# Patient Record
Sex: Female | Born: 1945 | Race: White | Hispanic: No | Smoking: Current every day smoker
Health system: Southern US, Community
[De-identification: ages and names within clinical notes are randomized; demographics above are authoritative.]

## PROBLEM LIST (undated history)

## (undated) DIAGNOSIS — K219 Gastro-esophageal reflux disease without esophagitis: Secondary | ICD-10-CM

## (undated) DIAGNOSIS — C801 Malignant (primary) neoplasm, unspecified: Secondary | ICD-10-CM

## (undated) DIAGNOSIS — E785 Hyperlipidemia, unspecified: Secondary | ICD-10-CM

## (undated) DIAGNOSIS — I2510 Atherosclerotic heart disease of native coronary artery without angina pectoris: Secondary | ICD-10-CM

## (undated) DIAGNOSIS — F419 Anxiety disorder, unspecified: Secondary | ICD-10-CM

## (undated) DIAGNOSIS — M1990 Unspecified osteoarthritis, unspecified site: Secondary | ICD-10-CM

## (undated) DIAGNOSIS — R609 Edema, unspecified: Secondary | ICD-10-CM

## (undated) DIAGNOSIS — E7800 Pure hypercholesterolemia, unspecified: Secondary | ICD-10-CM

## (undated) DIAGNOSIS — I219 Acute myocardial infarction, unspecified: Secondary | ICD-10-CM

## (undated) DIAGNOSIS — K589 Irritable bowel syndrome without diarrhea: Secondary | ICD-10-CM

## (undated) DIAGNOSIS — M2550 Pain in unspecified joint: Secondary | ICD-10-CM

## (undated) DIAGNOSIS — I509 Heart failure, unspecified: Secondary | ICD-10-CM

## (undated) DIAGNOSIS — I10 Essential (primary) hypertension: Secondary | ICD-10-CM

## (undated) DIAGNOSIS — M549 Dorsalgia, unspecified: Secondary | ICD-10-CM

## (undated) DIAGNOSIS — E039 Hypothyroidism, unspecified: Secondary | ICD-10-CM

## (undated) DIAGNOSIS — D649 Anemia, unspecified: Secondary | ICD-10-CM

## (undated) HISTORY — DX: Edema, unspecified: R60.9

## (undated) HISTORY — DX: Irritable bowel syndrome without diarrhea: K58.9

## (undated) HISTORY — PX: ABDOMINAL HYSTERECTOMY: SHX81

## (undated) HISTORY — DX: Heart failure, unspecified: I50.9

## (undated) HISTORY — DX: Dorsalgia, unspecified: M54.9

## (undated) HISTORY — DX: Anemia, unspecified: D64.9

## (undated) HISTORY — DX: Hyperlipidemia, unspecified: E78.5

## (undated) HISTORY — DX: Gastro-esophageal reflux disease without esophagitis: K21.9

## (undated) HISTORY — PX: MASTECTOMY: SHX3

## (undated) HISTORY — DX: Hypothyroidism, unspecified: E03.9

## (undated) HISTORY — PX: CORONARY ARTERY BYPASS GRAFT: SHX141

## (undated) HISTORY — DX: Pain in unspecified joint: M25.50

## (undated) HISTORY — DX: Anxiety disorder, unspecified: F41.9

## (undated) HISTORY — PX: DILATION AND CURETTAGE OF UTERUS: SHX78

## (undated) HISTORY — DX: Unspecified osteoarthritis, unspecified site: M19.90

## (undated) HISTORY — PX: CHOLECYSTECTOMY: SHX55

## (undated) HISTORY — PX: KIDNEY STONE SURGERY: SHX686

---

## 1999-03-21 ENCOUNTER — Encounter: Admission: RE | Admit: 1999-03-21 | Discharge: 1999-03-21 | Payer: Self-pay | Admitting: *Deleted

## 2000-06-04 ENCOUNTER — Encounter: Admission: RE | Admit: 2000-06-04 | Discharge: 2000-06-04 | Payer: Self-pay

## 2001-08-14 ENCOUNTER — Ambulatory Visit (HOSPITAL_BASED_OUTPATIENT_CLINIC_OR_DEPARTMENT_OTHER): Admission: RE | Admit: 2001-08-14 | Discharge: 2001-08-14 | Payer: Self-pay | Admitting: Plastic Surgery

## 2001-08-14 ENCOUNTER — Encounter (INDEPENDENT_AMBULATORY_CARE_PROVIDER_SITE_OTHER): Payer: Self-pay | Admitting: Specialist

## 2003-06-26 ENCOUNTER — Encounter: Admission: RE | Admit: 2003-06-26 | Discharge: 2003-06-26 | Payer: Self-pay | Admitting: Family Medicine

## 2004-01-01 ENCOUNTER — Ambulatory Visit (HOSPITAL_COMMUNITY): Admission: RE | Admit: 2004-01-01 | Discharge: 2004-01-01 | Payer: Self-pay | Admitting: Family Medicine

## 2004-01-04 ENCOUNTER — Ambulatory Visit (HOSPITAL_COMMUNITY): Admission: RE | Admit: 2004-01-04 | Discharge: 2004-01-04 | Payer: Self-pay | Admitting: Family Medicine

## 2004-01-08 ENCOUNTER — Ambulatory Visit (HOSPITAL_COMMUNITY): Admission: RE | Admit: 2004-01-08 | Discharge: 2004-01-08 | Payer: Self-pay | Admitting: Family Medicine

## 2004-04-18 ENCOUNTER — Ambulatory Visit (HOSPITAL_COMMUNITY): Admission: RE | Admit: 2004-04-18 | Discharge: 2004-04-18 | Payer: Self-pay | Admitting: Cardiology

## 2004-05-12 ENCOUNTER — Ambulatory Visit (HOSPITAL_COMMUNITY): Admission: RE | Admit: 2004-05-12 | Discharge: 2004-05-12 | Payer: Self-pay | Admitting: Family Medicine

## 2005-06-16 ENCOUNTER — Ambulatory Visit (HOSPITAL_COMMUNITY): Admission: RE | Admit: 2005-06-16 | Discharge: 2005-06-16 | Payer: Self-pay | Admitting: Family Medicine

## 2005-09-26 ENCOUNTER — Ambulatory Visit (HOSPITAL_COMMUNITY): Admission: RE | Admit: 2005-09-26 | Discharge: 2005-09-27 | Payer: Self-pay | Admitting: *Deleted

## 2005-11-19 ENCOUNTER — Emergency Department (HOSPITAL_COMMUNITY): Admission: EM | Admit: 2005-11-19 | Discharge: 2005-11-19 | Payer: Self-pay | Admitting: Emergency Medicine

## 2007-06-03 ENCOUNTER — Encounter (INDEPENDENT_AMBULATORY_CARE_PROVIDER_SITE_OTHER): Payer: Self-pay | Admitting: Family Medicine

## 2007-06-03 ENCOUNTER — Ambulatory Visit (HOSPITAL_COMMUNITY): Admission: RE | Admit: 2007-06-03 | Discharge: 2007-06-03 | Payer: Self-pay | Admitting: Family Medicine

## 2007-06-03 ENCOUNTER — Ambulatory Visit: Payer: Self-pay | Admitting: Vascular Surgery

## 2007-08-20 ENCOUNTER — Inpatient Hospital Stay (HOSPITAL_COMMUNITY): Admission: RE | Admit: 2007-08-20 | Discharge: 2007-08-26 | Payer: Self-pay | Admitting: Orthopedic Surgery

## 2007-10-04 ENCOUNTER — Encounter: Admission: RE | Admit: 2007-10-04 | Discharge: 2007-10-04 | Payer: Self-pay | Admitting: Orthopedic Surgery

## 2007-11-22 ENCOUNTER — Encounter: Admission: RE | Admit: 2007-11-22 | Discharge: 2007-11-22 | Payer: Self-pay | Admitting: Orthopedic Surgery

## 2007-11-29 ENCOUNTER — Encounter: Admission: RE | Admit: 2007-11-29 | Discharge: 2008-02-05 | Payer: Self-pay | Admitting: Orthopedic Surgery

## 2008-01-30 ENCOUNTER — Encounter: Admission: RE | Admit: 2008-01-30 | Discharge: 2008-01-30 | Payer: Self-pay | Admitting: Family Medicine

## 2008-02-05 ENCOUNTER — Encounter: Admission: RE | Admit: 2008-02-05 | Discharge: 2008-02-05 | Payer: Self-pay | Admitting: Family Medicine

## 2008-04-06 ENCOUNTER — Inpatient Hospital Stay (HOSPITAL_COMMUNITY): Admission: RE | Admit: 2008-04-06 | Discharge: 2008-04-14 | Payer: Self-pay | Admitting: Orthopedic Surgery

## 2008-08-19 ENCOUNTER — Inpatient Hospital Stay (HOSPITAL_COMMUNITY): Admission: EM | Admit: 2008-08-19 | Discharge: 2008-08-28 | Payer: Self-pay | Admitting: Emergency Medicine

## 2008-09-13 ENCOUNTER — Inpatient Hospital Stay (HOSPITAL_COMMUNITY): Admission: EM | Admit: 2008-09-13 | Discharge: 2008-09-18 | Payer: Self-pay | Admitting: Emergency Medicine

## 2008-10-02 ENCOUNTER — Encounter: Admission: RE | Admit: 2008-10-02 | Discharge: 2008-10-02 | Payer: Self-pay | Admitting: Orthopedic Surgery

## 2008-10-16 ENCOUNTER — Encounter: Admission: RE | Admit: 2008-10-16 | Discharge: 2008-10-16 | Payer: Self-pay | Admitting: Orthopedic Surgery

## 2008-11-19 ENCOUNTER — Observation Stay (HOSPITAL_COMMUNITY): Admission: EM | Admit: 2008-11-19 | Discharge: 2008-11-20 | Payer: Self-pay | Admitting: Cardiology

## 2009-05-03 ENCOUNTER — Inpatient Hospital Stay (HOSPITAL_COMMUNITY): Admission: EM | Admit: 2009-05-03 | Discharge: 2009-05-07 | Payer: Self-pay | Admitting: Emergency Medicine

## 2009-05-03 ENCOUNTER — Ambulatory Visit: Payer: Self-pay | Admitting: Internal Medicine

## 2009-05-04 ENCOUNTER — Encounter (INDEPENDENT_AMBULATORY_CARE_PROVIDER_SITE_OTHER): Payer: Self-pay | Admitting: Internal Medicine

## 2009-05-04 ENCOUNTER — Ambulatory Visit: Payer: Self-pay | Admitting: Vascular Surgery

## 2009-05-05 ENCOUNTER — Encounter: Payer: Self-pay | Admitting: Internal Medicine

## 2009-06-08 ENCOUNTER — Ambulatory Visit: Payer: Self-pay | Admitting: Vascular Surgery

## 2010-05-28 ENCOUNTER — Encounter: Payer: Self-pay | Admitting: Cardiology

## 2010-05-30 ENCOUNTER — Encounter: Payer: Self-pay | Admitting: Orthopedic Surgery

## 2010-05-30 ENCOUNTER — Encounter: Payer: Self-pay | Admitting: Family Medicine

## 2010-06-17 ENCOUNTER — Other Ambulatory Visit: Payer: Self-pay | Admitting: Family Medicine

## 2010-06-17 DIAGNOSIS — N289 Disorder of kidney and ureter, unspecified: Secondary | ICD-10-CM

## 2010-06-24 ENCOUNTER — Ambulatory Visit
Admission: RE | Admit: 2010-06-24 | Discharge: 2010-06-24 | Disposition: A | Discharge status: Home / Self Care | Payer: Medicare Other | Source: Ambulatory Visit | Attending: Family Medicine | Admitting: Family Medicine

## 2010-06-24 ENCOUNTER — Other Ambulatory Visit: Payer: Self-pay

## 2010-06-24 DIAGNOSIS — N289 Disorder of kidney and ureter, unspecified: Secondary | ICD-10-CM

## 2010-06-24 MED ORDER — GADOBENATE DIMEGLUMINE 529 MG/ML IV SOLN
14.0000 mL | Freq: Once | INTRAVENOUS | Status: AC | PRN
  Administered 2010-06-24: 14 mL via INTRAVENOUS

## 2010-08-08 LAB — CARDIAC PANEL(CRET KIN+CKTOT+MB+TROPI)
CK, MB: 3.5 ng/mL (ref 0.3–4.0)
CK, MB: 4.4 ng/mL — ABNORMAL HIGH (ref 0.3–4.0)
CK, MB: 7 ng/mL — ABNORMAL HIGH (ref 0.3–4.0)
Relative Index: 0.4 (ref 0.0–2.5)

## 2010-08-08 LAB — DIFFERENTIAL
Basophils Relative: 0 % (ref 0–1)
Basophils Relative: 0 % (ref 0–1)
Basophils Relative: 0 % (ref 0–1)
Eosinophils Absolute: 0 10*3/uL (ref 0.0–0.7)
Eosinophils Absolute: 0 10*3/uL (ref 0.0–0.7)
Lymphocytes Relative: 17 % (ref 12–46)
Lymphs Abs: 0.5 10*3/uL — ABNORMAL LOW (ref 0.7–4.0)
Lymphs Abs: 1.2 10*3/uL (ref 0.7–4.0)
Monocytes Absolute: 0.4 10*3/uL (ref 0.1–1.0)
Monocytes Relative: 2 % — ABNORMAL LOW (ref 3–12)
Monocytes Relative: 4 % (ref 3–12)
Monocytes Relative: 6 % (ref 3–12)
Neutro Abs: 5.3 10*3/uL (ref 1.7–7.7)
Neutro Abs: 7.3 10*3/uL (ref 1.7–7.7)
Neutrophils Relative %: 77 % (ref 43–77)
Neutrophils Relative %: 86 % — ABNORMAL HIGH (ref 43–77)
Neutrophils Relative %: 91 % — ABNORMAL HIGH (ref 43–77)

## 2010-08-08 LAB — CBC
HCT: 24.4 % — ABNORMAL LOW (ref 36.0–46.0)
HCT: 26.9 % — ABNORMAL LOW (ref 36.0–46.0)
HCT: 38.6 % (ref 36.0–46.0)
Hemoglobin: 12.9 g/dL (ref 12.0–15.0)
Hemoglobin: 8.1 g/dL — ABNORMAL LOW (ref 12.0–15.0)
Hemoglobin: 9 g/dL — ABNORMAL LOW (ref 12.0–15.0)
Hemoglobin: 9.1 g/dL — ABNORMAL LOW (ref 12.0–15.0)
MCHC: 33.4 g/dL (ref 30.0–36.0)
MCHC: 33.4 g/dL (ref 30.0–36.0)
MCHC: 33.8 g/dL (ref 30.0–36.0)
MCHC: 33.9 g/dL (ref 30.0–36.0)
MCV: 94.3 fL (ref 78.0–100.0)
MCV: 94.3 fL (ref 78.0–100.0)
MCV: 94.9 fL (ref 78.0–100.0)
Platelets: 146 10*3/uL — ABNORMAL LOW (ref 150–400)
RBC: 2.58 MIL/uL — ABNORMAL LOW (ref 3.87–5.11)
RBC: 2.7 MIL/uL — ABNORMAL LOW (ref 3.87–5.11)
RBC: 2.84 MIL/uL — ABNORMAL LOW (ref 3.87–5.11)
RBC: 2.88 MIL/uL — ABNORMAL LOW (ref 3.87–5.11)
RBC: 4.09 MIL/uL (ref 3.87–5.11)
RDW: 14 % (ref 11.5–15.5)
RDW: 14.8 % (ref 11.5–15.5)
RDW: 15.1 % (ref 11.5–15.5)
WBC: 6.9 10*3/uL (ref 4.0–10.5)
WBC: 7.8 10*3/uL (ref 4.0–10.5)
WBC: 8.7 10*3/uL (ref 4.0–10.5)

## 2010-08-08 LAB — COMPREHENSIVE METABOLIC PANEL
ALT: 19 U/L (ref 0–35)
ALT: 21 U/L (ref 0–35)
Albumin: 2.7 g/dL — ABNORMAL LOW (ref 3.5–5.2)
Alkaline Phosphatase: 80 U/L (ref 39–117)
BUN: 23 mg/dL (ref 6–23)
BUN: 26 mg/dL — ABNORMAL HIGH (ref 6–23)
CO2: 26 mEq/L (ref 19–32)
CO2: 30 mEq/L (ref 19–32)
Calcium: 7.8 mg/dL — ABNORMAL LOW (ref 8.4–10.5)
Calcium: 9.4 mg/dL (ref 8.4–10.5)
Chloride: 103 mEq/L (ref 96–112)
Creatinine, Ser: 0.99 mg/dL (ref 0.4–1.2)
GFR calc non Af Amer: 39 mL/min — ABNORMAL LOW (ref >60)
GFR calc non Af Amer: 47 mL/min — ABNORMAL LOW (ref >60)
Glucose, Bld: 113 mg/dL — ABNORMAL HIGH (ref 70–99)
Glucose, Bld: 119 mg/dL — ABNORMAL HIGH (ref 70–99)
Glucose, Bld: 123 mg/dL — ABNORMAL HIGH (ref 70–99)
Potassium: 3.1 mEq/L — ABNORMAL LOW (ref 3.5–5.1)
Sodium: 136 mEq/L (ref 135–145)
Sodium: 136 mEq/L (ref 135–145)
Total Bilirubin: 0.5 mg/dL (ref 0.3–1.2)
Total Protein: 5.8 g/dL — ABNORMAL LOW (ref 6.0–8.3)
Total Protein: 5.8 g/dL — ABNORMAL LOW (ref 6.0–8.3)
Total Protein: 8.3 g/dL (ref 6.0–8.3)

## 2010-08-08 LAB — BLOOD GAS, ARTERIAL
Acid-Base Excess: 2.8 mmol/L — ABNORMAL HIGH (ref 0.0–2.0)
O2 Content: 3 L/min
O2 Saturation: 94.7 %
pCO2 arterial: 38.9 mmHg (ref 35.0–45.0)

## 2010-08-08 LAB — CULTURE, BLOOD (ROUTINE X 2)

## 2010-08-08 LAB — LIPID PANEL
Cholesterol: 97 mg/dL (ref 0–200)
LDL Cholesterol: 48 mg/dL (ref 0–99)
Total CHOL/HDL Ratio: 3.9 RATIO
Triglycerides: 118 mg/dL (ref <150)

## 2010-08-08 LAB — BASIC METABOLIC PANEL
BUN: 9 mg/dL (ref 6–23)
CO2: 24 mEq/L (ref 19–32)
CO2: 26 mEq/L (ref 19–32)
Calcium: 7.9 mg/dL — ABNORMAL LOW (ref 8.4–10.5)
GFR calc Af Amer: >60 mL/min (ref >60)
GFR calc non Af Amer: >60 mL/min (ref >60)
Glucose, Bld: 134 mg/dL — ABNORMAL HIGH (ref 70–99)
Potassium: 3.1 mEq/L — ABNORMAL LOW (ref 3.5–5.1)
Potassium: 3.9 mEq/L (ref 3.5–5.1)
Sodium: 137 mEq/L (ref 135–145)
Sodium: 138 mEq/L (ref 135–145)

## 2010-08-08 LAB — URINALYSIS, ROUTINE W REFLEX MICROSCOPIC
Glucose, UA: NEGATIVE mg/dL
Hgb urine dipstick: NEGATIVE
Protein, ur: NEGATIVE mg/dL
Specific Gravity, Urine: 1.013 (ref 1.005–1.030)
pH: 5.5 (ref 5.0–8.0)

## 2010-08-08 LAB — URINE CULTURE
Colony Count: NO GROWTH
Culture: NO GROWTH

## 2010-08-08 LAB — SALICYLATE LEVEL: Salicylate Lvl: <4 mg/dL (ref 2.8–20.0)

## 2010-08-08 LAB — TSH: TSH: 0.226 u[IU]/mL — ABNORMAL LOW (ref 0.350–4.500)

## 2010-08-08 LAB — LACTIC ACID, PLASMA: Lactic Acid, Venous: 0.8 mmol/L (ref 0.5–2.2)

## 2010-08-08 LAB — APTT: aPTT: 34 seconds (ref 24–37)

## 2010-08-08 LAB — PROTIME-INR: Prothrombin Time: 15.5 seconds — ABNORMAL HIGH (ref 11.6–15.2)

## 2010-08-14 LAB — CBC
HCT: 33.8 % — ABNORMAL LOW (ref 36.0–46.0)
Hemoglobin: 11.5 g/dL — ABNORMAL LOW (ref 12.0–15.0)
MCHC: 34.1 g/dL (ref 30.0–36.0)
MCV: 91.8 fL (ref 78.0–100.0)
RDW: 15 % (ref 11.5–15.5)

## 2010-08-14 LAB — BASIC METABOLIC PANEL
BUN: 9 mg/dL (ref 6–23)
Calcium: 9 mg/dL (ref 8.4–10.5)
Creatinine, Ser: 0.74 mg/dL (ref 0.4–1.2)
GFR calc non Af Amer: >60 mL/min (ref >60)
Glucose, Bld: 97 mg/dL (ref 70–99)

## 2010-08-14 LAB — LIPID PANEL
LDL Cholesterol: 69 mg/dL (ref 0–99)
Total CHOL/HDL Ratio: 4.5 RATIO
Triglycerides: 179 mg/dL — ABNORMAL HIGH (ref <150)
VLDL: 36 mg/dL (ref 0–40)

## 2010-08-14 LAB — CARDIAC PANEL(CRET KIN+CKTOT+MB+TROPI): CK, MB: 0.5 ng/mL (ref 0.3–4.0)

## 2010-08-14 LAB — MAGNESIUM: Magnesium: 1.9 mg/dL (ref 1.5–2.5)

## 2010-08-16 LAB — CROSSMATCH
ABO/RH(D): B POS
Antibody Screen: POSITIVE
Donor AG Type: NEGATIVE

## 2010-08-16 LAB — BASIC METABOLIC PANEL
BUN: 11 mg/dL (ref 6–23)
CO2: 26 mEq/L (ref 19–32)
CO2: 28 mEq/L (ref 19–32)
Calcium: 8.8 mg/dL (ref 8.4–10.5)
Chloride: 102 mEq/L (ref 96–112)
Chloride: 94 mEq/L — ABNORMAL LOW (ref 96–112)
Chloride: 96 mEq/L (ref 96–112)
Creatinine, Ser: 0.96 mg/dL (ref 0.4–1.2)
GFR calc Af Amer: >60 mL/min (ref >60)
Glucose, Bld: 109 mg/dL — ABNORMAL HIGH (ref 70–99)
Potassium: 3.3 mEq/L — ABNORMAL LOW (ref 3.5–5.1)
Potassium: 4.3 mEq/L (ref 3.5–5.1)
Sodium: 130 mEq/L — ABNORMAL LOW (ref 135–145)

## 2010-08-16 LAB — CBC
HCT: 31.3 % — ABNORMAL LOW (ref 36.0–46.0)
Hemoglobin: 10 g/dL — ABNORMAL LOW (ref 12.0–15.0)
Hemoglobin: 8.6 g/dL — ABNORMAL LOW (ref 12.0–15.0)
MCHC: 34.3 g/dL (ref 30.0–36.0)
MCHC: 34.8 g/dL (ref 30.0–36.0)
MCV: 92.7 fL (ref 78.0–100.0)
MCV: 93.4 fL (ref 78.0–100.0)
MCV: 93.4 fL (ref 78.0–100.0)
Platelets: 325 10*3/uL (ref 150–400)
RBC: 3.08 MIL/uL — ABNORMAL LOW (ref 3.87–5.11)
RBC: 3.35 MIL/uL — ABNORMAL LOW (ref 3.87–5.11)
RDW: 15.9 % — ABNORMAL HIGH (ref 11.5–15.5)
WBC: 5.4 10*3/uL (ref 4.0–10.5)

## 2010-08-16 LAB — ANAEROBIC CULTURE

## 2010-08-16 LAB — HEMOGLOBIN AND HEMATOCRIT, BLOOD
HCT: 34.6 % — ABNORMAL LOW (ref 36.0–46.0)
Hemoglobin: 11.9 g/dL — ABNORMAL LOW (ref 12.0–15.0)

## 2010-08-16 LAB — GRAM STAIN: Gram Stain: NONE SEEN

## 2010-08-17 LAB — BASIC METABOLIC PANEL
BUN: 13 mg/dL (ref 6–23)
BUN: 14 mg/dL (ref 6–23)
BUN: 18 mg/dL (ref 6–23)
BUN: 21 mg/dL (ref 6–23)
BUN: 28 mg/dL — ABNORMAL HIGH (ref 6–23)
CO2: 25 mEq/L (ref 19–32)
CO2: 26 mEq/L (ref 19–32)
CO2: 27 mEq/L (ref 19–32)
CO2: 29 mEq/L (ref 19–32)
CO2: 30 mEq/L (ref 19–32)
Calcium: 8.3 mg/dL — ABNORMAL LOW (ref 8.4–10.5)
Calcium: 8.5 mg/dL (ref 8.4–10.5)
Calcium: 8.5 mg/dL (ref 8.4–10.5)
Calcium: 9.3 mg/dL (ref 8.4–10.5)
Calcium: 9.4 mg/dL (ref 8.4–10.5)
Chloride: 101 mEq/L (ref 96–112)
Chloride: 102 mEq/L (ref 96–112)
Chloride: 102 mEq/L (ref 96–112)
Chloride: 104 mEq/L (ref 96–112)
Chloride: 95 mEq/L — ABNORMAL LOW (ref 96–112)
Chloride: 99 mEq/L (ref 96–112)
Creatinine, Ser: 0.71 mg/dL (ref 0.4–1.2)
Creatinine, Ser: 0.91 mg/dL (ref 0.4–1.2)
Creatinine, Ser: 0.91 mg/dL (ref 0.4–1.2)
Creatinine, Ser: 1.13 mg/dL (ref 0.4–1.2)
Creatinine, Ser: 1.12 mg/dL (ref 0.4–1.2)
GFR calc Af Amer: 59 mL/min — ABNORMAL LOW (ref >60)
GFR calc Af Amer: >60 mL/min (ref >60)
GFR calc Af Amer: >60 mL/min (ref >60)
GFR calc Af Amer: 59 mL/min — ABNORMAL LOW (ref >60)
GFR calc non Af Amer: 49 mL/min — ABNORMAL LOW (ref >60)
GFR calc non Af Amer: 57 mL/min — ABNORMAL LOW (ref >60)
GFR calc non Af Amer: >60 mL/min (ref >60)
GFR calc non Af Amer: >60 mL/min (ref >60)
GFR calc non Af Amer: >60 mL/min (ref >60)
Glucose, Bld: 118 mg/dL — ABNORMAL HIGH (ref 70–99)
Glucose, Bld: 120 mg/dL — ABNORMAL HIGH (ref 70–99)
Glucose, Bld: 135 mg/dL — ABNORMAL HIGH (ref 70–99)
Potassium: 3.6 mEq/L (ref 3.5–5.1)
Potassium: 3.7 mEq/L (ref 3.5–5.1)
Potassium: 3.9 mEq/L (ref 3.5–5.1)
Potassium: 4.4 mEq/L (ref 3.5–5.1)
Potassium: 4.4 mEq/L (ref 3.5–5.1)
Sodium: 134 mEq/L — ABNORMAL LOW (ref 135–145)
Sodium: 135 mEq/L (ref 135–145)
Sodium: 140 mEq/L (ref 135–145)
Sodium: 137 mEq/L (ref 135–145)

## 2010-08-17 LAB — CBC
HCT: 24.3 % — ABNORMAL LOW (ref 36.0–46.0)
HCT: 30 % — ABNORMAL LOW (ref 36.0–46.0)
HCT: 31.7 % — ABNORMAL LOW (ref 36.0–46.0)
HCT: 32.8 % — ABNORMAL LOW (ref 36.0–46.0)
HCT: 34.3 % — ABNORMAL LOW (ref 36.0–46.0)
Hemoglobin: 10.3 g/dL — ABNORMAL LOW (ref 12.0–15.0)
Hemoglobin: 10.6 g/dL — ABNORMAL LOW (ref 12.0–15.0)
Hemoglobin: 11 g/dL — ABNORMAL LOW (ref 12.0–15.0)
Hemoglobin: 11.2 g/dL — ABNORMAL LOW (ref 12.0–15.0)
MCHC: 34.2 g/dL (ref 30.0–36.0)
MCHC: 34.4 g/dL (ref 30.0–36.0)
MCHC: 34.6 g/dL (ref 30.0–36.0)
MCHC: 34.8 g/dL (ref 30.0–36.0)
MCV: 93.7 fL (ref 78.0–100.0)
MCV: 98.6 fL (ref 78.0–100.0)
MCV: 98.7 fL (ref 78.0–100.0)
MCV: 99 fL (ref 78.0–100.0)
MCV: 98.4 fL (ref 78.0–100.0)
Platelets: 184 10*3/uL (ref 150–400)
Platelets: 187 10*3/uL (ref 150–400)
Platelets: 204 10*3/uL (ref 150–400)
Platelets: 207 10*3/uL (ref 150–400)
Platelets: 237 10*3/uL (ref 150–400)
Platelets: 302 10*3/uL (ref 150–400)
RBC: 3.04 MIL/uL — ABNORMAL LOW (ref 3.87–5.11)
RBC: 3.15 MIL/uL — ABNORMAL LOW (ref 3.87–5.11)
RBC: 3.21 MIL/uL — ABNORMAL LOW (ref 3.87–5.11)
RBC: 3.52 MIL/uL — ABNORMAL LOW (ref 3.87–5.11)
RDW: 14.1 % (ref 11.5–15.5)
RDW: 14.2 % (ref 11.5–15.5)
RDW: 14.4 % (ref 11.5–15.5)
WBC: 10.7 10*3/uL — ABNORMAL HIGH (ref 4.0–10.5)
WBC: 5.7 10*3/uL (ref 4.0–10.5)
WBC: 6.7 10*3/uL (ref 4.0–10.5)
WBC: 6.9 10*3/uL (ref 4.0–10.5)
WBC: 7.8 10*3/uL (ref 4.0–10.5)
WBC: 9.6 10*3/uL (ref 4.0–10.5)
WBC: 9.4 10*3/uL (ref 4.0–10.5)

## 2010-08-17 LAB — CROSSMATCH
ABO/RH(D): B POS
Antibody Screen: POSITIVE
Donor AG Type: NEGATIVE
Donor AG Type: NEGATIVE

## 2010-08-17 LAB — DIFFERENTIAL
Eosinophils Absolute: 0 10*3/uL (ref 0.0–0.7)
Lymphs Abs: 2.1 10*3/uL (ref 0.7–4.0)
Monocytes Relative: 4 % (ref 3–12)
Neutro Abs: 6.8 10*3/uL (ref 1.7–7.7)
Neutrophils Relative %: 72 % (ref 43–77)

## 2010-08-17 LAB — URINALYSIS, ROUTINE W REFLEX MICROSCOPIC
Bilirubin Urine: NEGATIVE
Glucose, UA: NEGATIVE mg/dL
Ketones, ur: NEGATIVE mg/dL
Nitrite: NEGATIVE
Specific Gravity, Urine: 1.005 (ref 1.005–1.030)
pH: 6.5 (ref 5.0–8.0)

## 2010-08-17 LAB — CK TOTAL AND CKMB (NOT AT ARMC)
CK, MB: 1 ng/mL (ref 0.3–4.0)
Relative Index: 0.8 (ref 0.0–2.5)
Total CK: 120 U/L (ref 7–177)

## 2010-08-17 LAB — APTT: aPTT: 26 seconds (ref 24–37)

## 2010-08-17 LAB — BRAIN NATRIURETIC PEPTIDE: Pro B Natriuretic peptide (BNP): 188 pg/mL — ABNORMAL HIGH (ref 0.0–100.0)

## 2010-08-17 LAB — TYPE AND SCREEN
Antibody Screen: POSITIVE
Donor AG Type: NEGATIVE

## 2010-08-17 LAB — LIPID PANEL
Cholesterol: 125 mg/dL (ref 0–200)
HDL: 33 mg/dL — ABNORMAL LOW (ref >39)
LDL Cholesterol: 69 mg/dL (ref 0–99)
Total CHOL/HDL Ratio: 3.8 RATIO

## 2010-09-20 NOTE — Op Note (Signed)
NAMERENIA, [name]               ACCOUNT NO.:  [id number]   MEDICAL RECORD NO.:  [id number]          PATIENT TYPE:  INP   LOCATION:  0004                         FACILITY:  [facility]   PHYSICIAN:  Madlyn Frankel. Charlann Boxer, M.D.  DATE OF BIRTH:  1946/02/23   DATE OF PROCEDURE:  04/06/2008  DATE OF DISCHARGE:                               OPERATIVE REPORT   PREOPERATIVE DIAGNOSIS:  Right hip osteoarthritis.   POSTOPERATIVE DIAGNOSIS:  Right hip osteoarthritis.   PROCEDURE:  Right total hip replacement.   COMPONENTS USED:  DePuy hip system, 54 pinnacle cup, 36 metal liner, 75  trial lock stem with 36 1.5 ball.   SURGEON:  Madlyn Frankel. Charlann Boxer, M.D.   ASSISTANT:  Dwyane Luo.   ANESTHESIA:  General.   SPECIMEN:  None.   DRAINS:  None.   COMPLICATIONS:  None.   BLOOD LOSS:  150 mL.   INDICATIONS FOR PROCEDURE:  Ms. Deviney is a 65 year old female with  history of left total hip replacement and has done well.  She had  progressive discomfort in the right hip and at this point wished to  proceed with a right total hip replacement.  We reviewed the risks and  benefits of the hospital course, postoperative course and expectations.  She would require a skilled nursing facility as she had before.  Consent  obtained for benefit of pain relief.   PROCEDURE IN DETAIL:  The patient was brought to the operative theater.  Once adequate anesthesia, preoperative antibiotics, Ancef administered,  the patient was positioned in the left lateral decubitus position with  right-side up.  Preoperatively, I measured her leg lengths in the supine  position and evaluated the position of her right lower extremity  compared to left in its lateral position.  A lateral based incision was  made for a posterior approach to the hip.  The iliotibial band and  gluteal fascia were then incised posteriorly.  The short external  rotators were taken down except for the posterior capsule was taken down  in an L capsule form.   The posterior leaflet was saved for later  anatomic repair as well as protection of the sciatic nerve from  retractors.  The hip was dislocated and a neck osteotomy was made to the  troch. fossa.  Attention was first directed to the acetabulum.  I  removed the labrum and debrided the pulvinar tissue and reamed down with  a 43 reamer and then reamed up to 53 reamer with good bony bed  preparation.  I impacted a 54 pinnacle cup.  Then with the pelvis at a  level position, the cup positioner indicated a position of about 35-40  degrees of abduction and 20 degrees of forward flexion.  I placed a  single cancellous screw to help the initial scratch fit fixation and  placed a 36 neutral metal liner.  This was impacted without difficulty  with good benefit.   Attention was now directed to the femoral canal preparation using the  starting drill, the hand reamer once, and then began broaching with a  size 1 broach and went  up to a size 7 broach.  This broach sat down  below my neck cut, so I used the calcar planer.  On the contralateral  hip, I used the 5 standard neck cut and it appeared to be a little bit  lower.  I took this in mind in my leg length evaluation.   The 7 broach was placed high offset neck,  I used this on the other side  and a 36.15 ball.   A trial reduction indicated the hip was very stable, tolerated hip  flexion and internal rotation to about 7 degrees. No evidence of  impingement or subluxation with abduction, internal rotation at 40-50  degrees, there was about a millimeter of shuck. No evidence of  impingement with external rotation extension.   The leg lengths appeared to be exactly as they were in the preoperative  position with relation to the knee and the femur.  Given this, I went  ahead and removed the trial components.  The final 7 high stem was  opened and impacted to the level where the broach had sat.  Based on  this, I re-trialed just to make certain I was  happy with the ball size  and chose a 36.15 ball and the final ball was impacted onto a clean and  dry trunnion and the hip reduced.  We irrigated the hip throughout the  case.  Again, at this point, there was no significant hemostasis that  was required, no bleeding throughout the case.  No drain was chosen.  The posterior capsule was reapproximated to the superior leaflet using a  #1 Vicryl.  The iliotibial band and gluteal fascia were reapproximated  using #1 Vicryl.  I used 2-0 Vicryl in the subcu layer and 4-0 running  Monocryl in the skin.  The hip was clean, dry and dressed sterilely with  Steri-Strips and a Mepilex dressing.  She was brought to the recovery  room in stable condition and tolerated the procedure well.      Madlyn Frankel Charlann Boxer, M.D.  Electronically Signed     MDO/MEDQ  D:  04/06/2008  T:  04/06/2008  Job:  [id number]

## 2010-09-20 NOTE — Consult Note (Signed)
NEW PATIENT CONSULTATION   [name], [name]  DOB:  08-25-1945                                       06/08/2009  OZHYQ#:[id number]   The patient is a 65 year old female referred for right upper extremity  edema with recent history of cellulitis.  She was accompanied by records  provided by Dr. Azucena Cecil for a recent hospitalization for cellulitis in  the right upper extremity with possible abscess.  She was treated with  Avelox for 2 weeks with resolution of the cellulitis.  She states that  her arm continues to be slightly more swollen than it has been over the  years.  She has had swelling for 30 years ever since she underwent a  right mastectomy for cancer in 1980.  She has never had effective  resolution of the lymphedema in her right upper extremity.  She has no  history of deep venous thrombosis or thrombophlebitis.  She does have a  sleeve which she has worn intermittently at times over the years but it  does not fit at this time.  She has not been seen by a lymphedema  specialist.   CHRONIC MEDICAL PROBLEMS:  1. Hypertension.  2. Coronary artery disease status post coronary bypass grafting.  3. Breast cancer and mastectomy 1984.  4. Hyperlipidemia.  5. Negative for diabetes or stroke or COPD.   PAST SURGICAL HISTORY:  Includes coronary artery bypass grafting,  hysterectomy, five operations for breast cancer and removal of several  kidney stones right side.   FAMILY HISTORY:  Positive for coronary artery disease with her father  and brother dying at young age of myocardial infarction.  Brother had  strokes.  Diabetes in a grandmother.   SOCIAL HISTORY:  She is single, has two children and is disabled and  retired.  She has smoked a pack to three packs of cigarettes a day for  40+ years.  Does not use alcohol.   REVIEW OF SYSTEMS:  Positive for reflux, dysphagia, constipation,  discomfort in the legs with walking, recent trauma to right fifth toe,  problems following right hip surgery, dizziness, blackouts, headaches,  muscle pain, anemia.  All other systems are negative.   PHYSICAL EXAMINATION:  Vital signs:  Blood pressure 128/77, heart rate  70, respirations 14.  General:  She is a female patient who is alert and  oriented.  She is well-developed and well-nourished.  Neck:  Supple.  3+  carotid pulses.  No bruits are audible.  HEENT:  Exam is unremarkable.  EOMs intact.  Chest:  Clear to auscultation.  Cardiovascular:  Regular  rhythm.  No murmurs.  There is an intact median sternotomy wound which  is well-healed.  Right mastectomy has been performed.  Abdomen:  Soft,  nontender with no masses.  Musculoskeletal:  No major deformities.  Neurological:  Unremarkable.  No weakness.  Skin:  Free of rashes.  Extremity:  Upper extremity reveals the right forearm to be 8 cm larger  in circumference compared to the left.  She has diffuse edema from the  right axilla to the fingers.  There is no evidence of any infection at  the present time with no cellulitis or fluctuance.  Left upper extremity  has no edema.  She has intact brachial and radial pulses at 3+  bilaterally.   I ordered a venous duplex exam  of the right upper extremity today which  I reviewed and interpreted.  There is no evidence of deep venous  obstruction in the right upper extremity.  I also reviewed her records  as noted provided by Dr. Azucena Cecil.   IMPRESSION:  Chronic lymphedema right upper extremity following  mastectomy with recent exacerbation - cellulitis now resolving following  2 week course of antibiotic (Avelox).   RECOMMENDATIONS:  I have referred her to a lymphedema therapist Alvira Monday) located on [address], phone [phone] to treat this chronic  problem of lymphedema.  She will always be at risk of developing  cellulitis in the right upper extremity because of the chronic edema.  No other recommendations.     Quita Skye Hart [place], M.D.   Electronically Signed   JDL/MEDQ  D:  06/08/2009  T:  06/09/2009  Job:  [id number]   cc:   Tally Joe, M.D.

## 2010-09-20 NOTE — Op Note (Signed)
NAMEGERLENE, GLASSBURN               ACCOUNT NO.:  [id number]   MEDICAL RECORD NO.:  [id number]          PATIENT TYPE:  INP   LOCATION:  1606                         FACILITY:  [facility]   PHYSICIAN:  Madlyn Frankel. Charlann Boxer, M.D.  DATE OF BIRTH:  02-Sep-1945   DATE OF PROCEDURE:  08/24/2008  DATE OF DISCHARGE:                               OPERATIVE REPORT   PREOPERATIVE DIAGNOSIS:  Closed right periprosthetic femur fracture,  type C, distal to a well-fixed proximal right hip prosthesis that had  been previously fixed with an open reduction and internal fixation.   POSTOPERATIVE DIAGNOSIS:  Closed right periprosthetic femur fracture,  type C, distal to a well-fixed proximal right hip prosthesis that had  been previously fixed with an open reduction and internal fixation.   PROCEDURE:  Open reduction and internal fixation of right periprosthetic  femur fracture utilizing the Zimmer cable plate, 8-hole plate, and  anterior cortical strut graft with 6 cables around the plate and 4  bicortical screws into the distal portion of the femur.   SURGEON:  Madlyn Frankel. Charlann Boxer, MD.   ASSISTANT:  Surgical tech   ANESTHESIA:  General.   BLOOD LOSS:  500 mL.   DRAINS:  None.   COMPLICATIONS:  None.   SPECIMENS:  None.   INDICATIONS FOR PROCEDURE:  Ms. Cazarez is 65 year old patient of mine  with a history of index right total hip replacement in November of 2009.  During her hospitalization at that time, she had fallen several times  and sustained a periprosthetic femur fracture at that time.  At that  point, it was a type B fracture pattern that was within the body of the  prosthesis and I chose to fix it with an open reduction and internal  fixation with cable fixation.  In the postoperative period, she had  initially gone to a nursing facility and transferred home.  She had been  questionably compliant on her activity level and medication use.  She  had had some complaints of anterior thigh pain, and  we even prescribed a  bone stimulator in an effort to try to get some bone healing.   Unfortunately on the 14th of this month, she was in her house and trying  to get to the wheelchair when her leg gave way.  It is uncertain whether  or not the fracture happened prior to fall or a fall.  Nonetheless, she  was brought to the emergency room where radiographs revealed a  periprosthetic femur fracture and she was admitted.  Initially, we were  going to try to operate on her on the 15th; however, she needed medical  clearance.  Once medical clearance was obtained, repeat cardiac  examination due to a history of congestive heart failure and she was  cleared for surgery.  Risks and benefits have been reviewed with Ms.  Kuhnle and her family including her increased risks of infection due to  multiple operations on the same hip in addition to risks of nonunion due  to her smoking.  I also touched bases about the concerns on the narcotic  use and  compliance.  Standard risks of infection and nonunion were also  discussed in the normal setting for the benefit of fractured union, and  surgery was chosen.   PROCEDURE IN DETAIL:  The patient was brought to the operative theater.  Once adequate anesthesia, preoperative antibiotics, Ancef 2 g  administered, the patient was positioned into the left lateral decubitus  position with the right side up.  The right lower extremity was then pre-  scrubbed due to the fact she had been in the hospital for a bit and then  prepped and draped in sterile fashion.  It was basically prepped from  the knee up.   I basically started the incision at the level of the trochanter and  extended it down distally and probably two-thirds of the way down the  femur, the iliotibial band was identified and then divided.  I then  incised the posterior aspect of the vastus lateralis fascia and elevated  the vastus lateralis anteriorly over the fracture site.  The fracture  site  was identified.   Once I had identified the fracture fragment, I  cleared out some of the  old attempted bone union using a curette in trying to freshen up the  fracture site.   I chose a Zimmer 8-hole plate, which fit best along this area and for  her femur.  I also chose a cortical strut graft to fit along the  anterior aspect of the femur.  I did use an oscillating saw to resect a  portion of the allograft in order for it to fit favorably along the  anterior aspect of the femur.   At this point, the plate was placed onto the lateral aspect of the femur  with a visible reduction of the fracture site and initially I held the  plate in place with bone-holding clamps.  Once I was satisfied with the  reduction and had everything set up and planned, I placed 4 bicortical  screws into the distal part of the plate.  This, thus, secured the plate  to the bone and helping with the overall reduction of the fracture.   At this point, I passed 6 cables through the plate, 4 proximal to the  fracture, and 2 distal.  With these cables in place, I then slid the  strut graft over the anterior aspect of the femur.   With the bone-holding clamp still in the proximal part of the fracture  and the cables in place with the strut graft,  I began to tension things  down.  I went ahead and tensioned the distal 2 securing the allograft to  the bone distally.  I then worked proximally.  The 2 cables that covered  the strut and the plate proximal fracture site were tensioned and then  locked into position so I could then place the other 2 cables, the most  proximal 2 cables were then passed.   Please note that I did remove probably 4 of the cables that were  previously placed.  Once all cables were tensioned appropriately and cut  and secured to the plate, I irrigated the wound copiously with normal  saline solution.  I then placed 5 mL of cancellous bone chips with a 5  mL putty matrix of demineralized  bone graft.  I packed this in and  around the fracture site.  Once this was done, I reapproximated the  iliotibial band back to itself over the top of the vastus lateralis,  placing a  few sutures within the vastus lateralis fascia to  reapproximate it over the plate.  The remainder of the wound was closed  with 2-0 Vicryl and then staples on the skin.  The skin was cleaned,  dried, and dressed sterilely with 2 Mepilex dressings.  She was then  brought to the recovery room in stable condition, tolerating the  procedure well, and extubated.      Madlyn Frankel Charlann Boxer, M.D.  Electronically Signed     MDO/MEDQ  D:  08/24/2008  T:  08/24/2008  Job:  [id number]

## 2010-09-20 NOTE — Op Note (Signed)
[name], [name]               ACCOUNT NO.:  [id number]   MEDICAL RECORD NO.:  [id number]          PATIENT TYPE:  INP   LOCATION:  1602                         [facility]ACILITY:  [facility]   PHYSICIAN:  Madlyn Frankel. Charlann Boxer, M.D.  DATE OF BIRTH:  08/12/1945   DATE OF PROCEDURE:  04/09/2008  DATE OF DISCHARGE:                               OPERATIVE REPORT   PREOPERATIVE DIAGNOSIS:  Failed right total hip replacement with  periprosthetic femur fracture after several falls in the hospital.   POSTOPERATIVE DIAGNOSIS:  Failed right total hip replacement with  periprosthetic femur fracture after several falls in the hospital.   PROCEDURE:  Revision of right total hip replacement with open reduction  internal fixation of the right femur as a periprosthetic femur fracture.   SURGEON:  Madlyn Frankel. Charlann Boxer, M.D.   ASSISTANT:  Dwyane Luo, PA   I utilized a [employer] size 8 high offset stem with the same 36 1.5  ball.  I used four Dall-Miles cables and one 16 gauge wire.   ANESTHESIA:  General.   SPECIMENS:  None.   FINDINGS:  There was a large spiral fracture extending approximately 5  to 6 cm below the tip of the stem.   BLOOD LOSS:  400 mL.   DRAINS:  None.   COMPLICATION:  None.   INDICATIONS FOR PROCEDURE:  [name] is a 65 year old female who  unfortunately underwent primary right total hip placement April 06, 2008.  Her postop course was going fairly well, medically stable other  than some confusion due to her perioperative medications and some pain  medicine.  She had gotten up on her own several times and had fallen on  April 08, 2008.  On April 08, 2008 she fell in the evening.  Nurses  noted that the leg was externally rotated and radiographs were obtained.  Fracture was identified radiographically.  I reviewed with the patient  and her daughter her current situation and the fact that she needed to  have an operation as the stem had become unstable.   Consent was  obtained for the benefit of reduction of the femur and  overall stability of her right hip.   PROCEDURE IN DETAIL:  The patient was brought to operative theater.  Once adequate anesthesia, preoperative antibiotics, Ancef administered,  the patient was positioned the left lateral decubitus position with the  right side up.  We removed her Steri-Strips and pre scrubbed the right  hip and then prepped and draped in sterile fashion with DuraPrep and  Ioban.  I extended her incision about an inch posteriorly.  I then  extended it down midway down the thigh.  A sharp dissection was carried  to the iliotibial band and gluteal fascia.  This was incised posteriorly  proximally and then along the iliotibial band distally.  I then elevated  the vastus lateralis off the posterior intermuscular septum.  Fracture  site was identified.   Initially my plan was to try to reduce the fracture in situ and then  remove the stem and try to replace the same type of stem  prosthesis.  However, the fracture was somewhat displaced and I had a difficult time  reducing this into an anatomic fashion without removing the stem, so I  carefully dislocated the hip.  I was able to remove he femoral head.  I  had actually use a bone tamp in a retrograde fashion to hit the femoral  stem out as it had at least a very good initial scratch fit proximally.   Once I removed the stem, this freed up the fragments.  I was able to  reduce the fracture into an anatomic position.  I initially used a 16  gauge wires, approximately four of them to hold the fracture in anatomic  position.  Once it was reduced anatomically, by visualization and  digital palpation, I did place the Dall-Miles cables with the locking  mechanism on it to hold the fracture reduced.  Once I had the fracture  in anatomic position, I flexed and internally rotated the hip so I could  prepare the femur.  It was my primary objective at this point to provide  her  with a proximally loaded stem that she had had before for longevity.  We did have backup stems, a Solution 8 inch stem to bypass the fracture  site.  I did broach and did a trial reduction with size 8 Tri-Lock stem  which had a good fit.  We took a radiograph in the operating room which  indicated an anatomic reduction of the fracture with the stem in  anatomic position.   Given these findings, I removed the trial components, then impacted the  size 8 Tri-Lock stem into the femur.  Once this was done, I retightened  all the Dall-Miles cables prior to crimping them.  I left the distal-  most 16 gauge wire which was at the very tip of the fracture site.   Once all the cables were crimped and cables cut, we irrigated the wound.  I at this point assessed that she had approximately 40 to 45 degrees of  combined anteversion.  I did not stress range of motion of this point  but know from the initial operation that she was stable.   At this point I irrigated the thigh wound as we had done throughout the  case.  I did not use Hemovac drain.  I did reapproximate posterior  capsule superior capsular tissue with #1 Vicryl, reapproximated the  gluteal fascia and the iliotibial band using #1 Vicryls.  The remainder  of the wound was closed with 2-0 Vicryl and staples on the skin.  Skin  was cleaned, dried and dressed sterilely with Mepilex dressing.  She was  brought to the recovery room in stable condition tolerating the  procedure well.      Madlyn Frankel Charlann Boxer, M.D.  Electronically Signed     MDO/MEDQ  D:  04/09/2008  T:  04/10/2008  Job:  [id number]

## 2010-09-20 NOTE — Discharge Summary (Signed)
[name], [name]               ACCOUNT NO.:  [id number]   MEDICAL RECORD NO.:  [id number]          PATIENT TYPE:  INP   LOCATION:  1618                         FACILITY:  [facility]   PHYSICIAN:  Madlyn Frankel. Charlann Boxer, M.D.  DATE OF BIRTH:  12/17/1945   DATE OF ADMISSION:  09/13/2008  DATE OF DISCHARGE:  09/18/2008                               DISCHARGE SUMMARY   ADMITTING DIAGNOSES:  1. Osteoarthritis.  2. Congestive heart failure.  3. Hypertension.  4. Anxiety, depression.  5. Hyperlipidemia.  6. Coronary artery disease.  7. Stomach cancer.  8. Breast cancer.  9. Asthma.  10.Gastroesophageal reflux disease.  11.Anemia.   DISCHARGE DIAGNOSES:  1. Osteoarthritis.  2. Congestive heart failure.  3. Hypertension.  4. Anxiety, depression.  5. Hyperlipidemia.  6. Stomach cancer.  7. Breast cancer.  8. Asthma.  9. Coronary artery disease.  10.Gastroesophageal reflux disease.  11.Anemia, treated by transfusion.   HISTORY OF PRESENT ILLNESS:  Mrs. Boran is a 65 year old female well  known to our orthopedic practice who has had a recent history of open  reduction, internal fixation of a right periprosthetic femur fracture.  She was in a nursing facility.  She noticed increased bleeding from the  wound.  She went to the [facility], was then transferred to our  hospital due to this bleeding.  She was seen by orthopedics with  determination of having a hematoma, noninfectious, with need for  incision and drainage and washout.   CONSULTS:  None.   PROCEDURE:  Incision and drainage and washout of a hematoma of her right  hip by Dr. Durene Romans.   LABORATORY DATA:  CBC final readings:  White blood cells 6.4, hemoglobin  11.9, hematocrit 34.6, platelets 235.  Metabolic sodium 136, potassium  4.1, BUN 12, creatinine 0.96, glucose 109.   HOSPITAL COURSE:  The patient was admitted to the hospital through  transfer to orthopedics.  Based upon examination, it was determined a  washout would be most effective.  We did go ahead and start her on some  IV vancomycin.  We also started her on Lyrica 50 mg p.o. b.i.d. to  prevent neuropathic pain.  She was taken to the OR on the 11th.  Hematoma was washed out.  A Hemovac drain was placed which remained for  2 days.  She did have some persistent drainage from that Hemovac portal  once the Hemovac was discontinued on day 2.  We transfused her 2 units  of blood on the 13th due to hematocrit of 25.2 which then came up nearly  9 points to 34.6.  We discontinued the vancomycin.  We started her on  doxycycline for chronic suppression therapy and she was stable and ready  for discharge home after the wound dressing was changed with no  significant drainage from the wound but some drainage from the Hemovac  portal.   DISCHARGE DISPOSITION:  Discharge to skilled nursing facility rehab  stable and improved condition.   DISCHARGE DIET:  Heart healthy.   DISCHARGE WOUND CARE:  Keep wound dry.  There are 2 separate staple  incision  areas.  There is a Hemovac portal which is covered with gauze.  This may have some persistent drainage over the next several days.  Just  continue to dry dress with compression gauze dressing.  We will plan to  take her staples out at 2 weeks in the office.   DISCHARGE PHYSICAL THERAPY:  She is touchdown weightbearing on the right  side, is 25%.   DISCHARGE MEDICATIONS:  1. Norco 7.5/325 one to 2 p.o. q.4-6 p.r.n. pain.  2. Robaxin 500 mg 1 p.o. q.6 scheduled for 2 weeks and then as needed      for muscle spasm pain thereafter.  3. Doxycycline 100 mg 1 p.o. b.i.d.  4. Lyrica 50 mg 1 p.o. b.i.d. to reduce neuropathic pain.  5. Vytorin 10/40 one p.o. q.h.s.  6. Plavix 75 mg 1 p.o. daily.  Restart on Sep 21, 2008.  7. Atenolol 25 mg 1 p.o. b.i.d.  8. Furosemide 40 mg 1 p.o. b.i.d.  9. Cymbalta 60 mg 1 p.o. q.h.s.  10.Nexium 40 mg 1 p.o. daily.  11.Vitamin B 12,000 mcg IM monthly.   12.Enteric-coated aspirin 325 mg 1 p.o. daily but hold 2 weeks.      Restart it on Oct 02, 2008.  13.Librium 25 mg 1 p.o. t.i.d.  14.Phenergan 25 mg 1 p.o. q.6 p.r.n. nausea, vomiting.  15.Vitamin D 50,000 units weekly.  16.Calcium 600 mg plus vitamin D 400 units p.o. b.i.d.  17.Albuterol inhaler 2 puffs q.6 p.r.n.  18.Nitroglycerin 0.4 p.r.n.  19.Celebrex 200 mg 1 p.o. daily.   DISCHARGE FOLLOWUP:  Follow up with Dr. Charlann Boxer at [phone] in 2 weeks for  wound check.     ______________________________  Yetta Glassman. [address], [place]      Madlyn Frankel. Charlann Boxer, M.D.  Electronically Signed    BLM/MEDQ  D:  09/18/2008  T:  09/18/2008  Job:  [id number]

## 2010-09-20 NOTE — H&P (Signed)
[name], [name]               ACCOUNT NO.:  [id number]   MEDICAL RECORD NO.:  [id number]          PATIENT TYPE:  INP   LOCATION:  1307                         FACILITY:  [facility]   PHYSICIAN:  Marlowe Kays, M.D.  DATE OF BIRTH:  1945-09-25   DATE OF ADMISSION:  09/13/2008  DATE OF DISCHARGE:                              HISTORY & PHYSICAL   READMISSION NOTE.   This 65 year old female is readmitted today as a transfer from [facility] emergency room.  Sixteen days ago she had revision right hip  surgery performed by Dr. Charlann Boxer.  She had several units of blood  administered during surgery.  She was discharged to a nursing facility  in [place] and returned to Dr. Nilsa Nutting office 3 days ago where  staples were removed.  She was noted to have a little bit of bleeding at  that time, she was on Plavix.  Earlier this morning she had apparently a  large amount of drainage which prompted her visit to the [facility]  emergency room and she has had a workup there where she was found to be  afebrile (she does report some chills and fever, a white count of 6300.  A CT scan of her hip was felt to indicate fluid pockets consistent with  possible abscess.  The ER physician called me earlier today requesting  transfer and he suggested hospital admission.  He had taken blood  cultures and started her on vancomycin.  Other than that she has had no  change in her medical history since she was discharged here.  She is  taking aspirin, atenolol, chlordiazepoxide, Cymbalta, furosemide,  hydrocodone, isosorbide, lisinopril, Plavix, polyethylene glycol and  Vytorin.   She is allergic to:  1. CODEINE.  2. DURAGESIC.  3. NARCAN.  4. SULFA DRUGS.   SOCIAL HISTORY:  Positive for the fact that she smokes and records  report 1 pack a day, she says 2 or 3 cigarettes a day.   PHYSICAL EXAMINATION:  At [facility], her blood pressure was 117/57, heart  rate was 56 so there was no tachycardia, and  temperature is 97.9.  On  exam today:  HEENT:  Unremarkable.  LUNGS:  Have some scattered rales bilaterally posteriorly.  HEART:  Regular rhythm without murmur, rub or gallop.  ABDOMEN:  Soft, nontender, without masses.  RIGHT HIP:  Incision looks healed.  The areas of drainage are not  draining at the present time.  She does have some organizing hematoma  superior and distal to the incision, consistent with her postoperative  situation and neurovascular function is grossly intact in her right  lower extremity.   IMPRESSION:  Possible postoperative wound infection, right lateral hip  revision surgery versus expected postoperative appearance in an  individual with this extent of surgery.   PLAN:  Admit and evaluate further.           ______________________________  Marlowe Kays, M.D.     JA/MEDQ  D:  09/13/2008  T:  09/13/2008  Job:  [id number]

## 2010-09-20 NOTE — Discharge Summary (Signed)
[name], [name]               ACCOUNT NO.:  [id number]   MEDICAL RECORD NO.:  [id number]          PATIENT TYPE:  INP   LOCATION:  1608                         FACILITY:  [facility]   PHYSICIAN:  Madlyn Frankel. Charlann Boxer, M.D.  DATE OF BIRTH:  12/09/1945   DATE OF ADMISSION:  08/20/2007  DATE OF DISCHARGE:  08/26/2007                               DISCHARGE SUMMARY   ADDENDUM:   MEDICATIONS:  The patient will require only 8 more days of Lovenox 40 mg  subcutaneous, as she has remained in the hospital for 3 additional days.   HOSPITAL COURSE:  The patient remained in the hospital over the weekend,  as she had difficulty with bowel movements.  She was given some Senokot  and then magnesium citrate, due to having a positive bowel movement.  A  bed was found with patient's approval with bed availability.  She still  continued to make moderate progress, still required further  rehabilitation to be able to live independently.  She was stable and  improved and ready for discharge on Monday.   DISCHARGE DISPOSITION:  Discharged to a skilled nursing facility  rehabilitation for further progress.     ______________________________  Yetta Glassman [address], [place]      Madlyn Frankel. Charlann Boxer, M.D.  Electronically Signed    BLM/MEDQ  D:  08/26/2007  T:  08/26/2007  Job:  [id number]

## 2010-09-20 NOTE — H&P (Signed)
[name], [name]               ACCOUNT NO.:  [id number]   MEDICAL RECORD NO.:  [id number]          PATIENT TYPE:  INP   LOCATION:  NA                           FACILITY:  [facility]   PHYSICIAN:  Madlyn Frankel. Charlann Boxer, M.D.  DATE OF BIRTH:  03/16/1946   DATE OF ADMISSION:  04/06/2008  DATE OF DISCHARGE:                              HISTORY & PHYSICAL   PROCEDURE:  Right total hip replacement.   CHIEF COMPLAINT:  Right hip pain.   HISTORY OF PRESENT ILLNESS:  A 65 year old female with a history of  right hip pain secondary to osteoarthritis.  It has been refractory to  all conservative treatment.  She does have a significant history of a  left total hip replacement in April 2009.  She has done well with this  hip.   PAST MEDICAL HISTORY:  1. Significant for osteoarthritis.  2. Depression and anxiety.  3. Stomach cancer.  4. Breast cancer.  5. Hypertension.  6. Asthma.  7. Congestive heart failure.  8. Coronary artery disease.  9. Osteoporosis.  10.Hyperlipidemia.  11.Phlebitis.  12.GERD.  13.Anemia.   PAST SURGICAL HISTORY:  1. Left total hip replacement in April 2009.  2. Right kidney surgery.  3. Triple bypass surgery.  4. Cancer surgery x3.  5. Cholecystectomy.  6. Appendectomy.   FAMILY HISTORY:  Coronary artery disease, Alzheimer's, cancer.   DRUG ALLERGIES:  No known drug allergies.   CURRENT MEDICATIONS:  Please verify doses and frequency with the patient  at time of admission.  1. Atenolol 25 mg 1 p.o. b.i.d.  2. Librium 25 mg 1 p.o. t.i.d.  3. Bentyl 20 mg p.o. b.i.d.  4. Cymbalta 60 mg p.o. nightly.  5. Vytorin unknown dosage amount 1 tab p.o. daily.  6. Lasix 40 mg p.o. b.i.d.  7. Xopenex inhaler 0.63 mg nebulizer q.6 p.r.n.   REVIEW OF SYSTEMS:  See HPI.   PHYSICAL EXAMINATION:  VITAL SIGNS:  Pulse 72, respirations 16, blood  pressure 134/78.  GENERAL:  Awake, alert, and oriented.  Well developed, well nourished in  no acute distress.  HEENT:   Normocephalic.  NECK:  Supple.  No carotid bruits.  CHEST AND LUNGS:  Clear to auscultation bilaterally.  BREASTS:  Deferred.  HEART:  Regular rate and rhythm.  S1, S2 distinct.  ABDOMEN:  Soft, nontender.  Bowel sounds present.  PELVIC:  Stable.  GENITOURINARY:  Deferred.  EXTREMITIES:  Right hip has increased pain, decrease range of motion.  Left anterior thigh pain.  SKIN:  No cellulitis.  NEUROLOGIC:  Intact distal sensibilities.   LABORATORY DATA:  Labs, EKG, and chest x-ray all pending presurgical  testing.   IMPRESSION:  Right hip osteoarthritis.   PLAN OF ACTION:  Right total hip replacement April 06, 2008 at [facility] with surgeon Dr. Durene Romans.  Risks and complications  were discussed.   No postoperative medications were provided.  The patient will require  rehab stay postoperatively.     ______________________________  [name], [place]      Madlyn Frankel. Charlann Boxer, M.D.  Electronically Signed  BLM/MEDQ  D:  03/25/2008  T:  03/25/2008  Job:  [id number]   cc:   Antionette Char, MD  Fax: [phone]   Tally Joe, M.D.  Fax: [phone]

## 2010-09-20 NOTE — H&P (Signed)
NAMEJUNETTE, [name]               ACCOUNT NO.:  [id number]   MEDICAL RECORD NO.:  [id number]          PATIENT TYPE:  INP   LOCATION:  1606                         FACILITY:  [facility]   PHYSICIAN:  Madlyn Frankel. Charlann Boxer, M.D.  DATE OF BIRTH:  June 26, 1945   DATE OF ADMISSION:  08/19/2008  DATE OF DISCHARGE:                              HISTORY & PHYSICAL   REASON FOR ADMISSION:  Right hip pain after a fall.   HISTORY OF PRESENT ILLNESS:  A 65 year old female, well-known to Dr.  Nilsa Nutting practice, with a history of right total hip replacement and  periprosthetic fracture fell at home.  She had immediate pain and was  brought to the emergency department.  X-rays in the emergency department  showed a distal prosthesis periprosthetic fracture through an old  nonunion fracture site.  She was admitted to the hospital for definitive  surgical management.   PAST MEDICAL HISTORY:  Significant for:  1. Osteoarthritis.  2. Congestive heart failure.  3. Hypertension.  4. Anxiety and depression.  5. Hyperlipidemia.  6. Stomach cancer.  7. Breast cancer.  8. Asthma.  9. Anemia.   PAST SURGICAL HISTORY:  Includes:  1. A left total hip placement in April 2009.  2. Right kidney surgery.  3. Triple bypass surgery.  4. Cancer surgery x3.  5. Cholecystectomy.  6. Appendectomy.   FAMILY HISTORY:  Coronary artery disease, Alzheimer's, cancer.   DRUG ALLERGIES:  NO KNOWN DRUG ALLERGIES.   CURRENT MEDICATIONS:  1. Vytorin 10/41 p.o. q.h.s.  2. Plavix 75 mg one p.o. daily.  3. Atenolol 25 mg one p.o. b.i.d.  4. Lasix 40 mg one p.o. b.i.d.  5. Cymbalta 60 mg one p.o. q.h.s.  6. Albuterol inhaler p.r.n.  7. Nitro p.r.n.  8. Vitamin B12 1000 mcg injection q. monthly.  9. Bentyl 20 mg p.r.n.  10.Nexium 40 mg daily.  11.Celebrex p.r.n.  12.Aspirin 325 mg p.o. daily.  13.Advil 800 mg p.r.n.  14.Librium 25 mg p.o. t.i.d.   REVIEW OF SYSTEMS:  See HPI.   PHYSICAL EXAMINATION:  Blood pressure 146/68,  respirations 18, pulse 56.  GENERAL:  Wake, alert and oriented, in distress due to pain.  NECK: Supple.  No carotid bruits.  CHEST/LUNGS:  Clear to auscultation bilaterally.  HEART: S1-S2 distinct.  ABDOMEN:  Soft and nontender.  RIGHT LOWER EXTREMITY:  Painful with any movement, placed in traction.  NEUROVASCULAR:  Intact.   LABORATORY DATA:  Pending.   IMPRESSION:  Right periprosthetic femur fracture.   PLAN OF ACTION:  Admit to the hospital for pain management, clearance  and then definitive surgical management by Dr. Charlann Boxer.  Risks and  complications were discussed with the patient and family.     ______________________________  Yetta Glassman [address], [place]      Madlyn Frankel. Charlann Boxer, M.D.  Electronically Signed    BLM/MEDQ  D:  08/28/2008  T:  08/28/2008  Job:  [id number]   cc:   Madlyn Frankel Charlann Boxer, M.D.  Fax: [phone]

## 2010-09-20 NOTE — Discharge Summary (Signed)
NAMEADELAINE, [name]               ACCOUNT NO.:  [id number]   MEDICAL RECORD NO.:  [id number]          PATIENT TYPE:  INP   LOCATION:  1606                         FACILITY:  [facility]   PHYSICIAN:  Madlyn Frankel. Charlann Boxer, M.D.  DATE OF BIRTH:  Dec 26, 1945   DATE OF ADMISSION:  08/19/2008  DATE OF DISCHARGE:  08/28/2008                               DISCHARGE SUMMARY   ADMITTING DIAGNOSES:  1. Osteoarthritis.  2. Congestive heart failure.  3. Hypertension.  4. Anxiety and depression.  5. Hyperlipidemia.  6. Coronary artery disease.  7. Stomach cancer.  8. Breast cancer.  9. Asthma.  10.Gastroesophageal reflux disease.  11.Anemia.   DISCHARGE DIAGNOSES:  1. Osteoarthritis.  2. Congestive heart failure.  3. Hypertension.  4. Anxiety and depression.  5. Hyperlipidemia.  6. Stomach cancer.  7. Breast cancer.  8. Asthma.  9. Coronary artery disease.  10.Gastroesophageal reflux disease.  11.Anemia with acute blood loss anemia in the hospital.   CONSULTANTS:  Dr. Alanda Amass with [facility] due to  chronic coronary artery disease and congestive heart failure issues and  presurgical clearance stabilization.   PROCEDURE:  An open reduction, internal fixation of a right  periprosthetic femur fracture.   SURGEON:  Dr. Durene Romans.   LABORATORY DATA:  CBC final count showed a white blood cell at 5.7,  hemoglobin 9.7, hematocrit 27.8 and platelets 184.  Metabolic:  Sodium  136, potassium 3.5, BUN 14, creatinine 0.91, glucose 102.  Radiology:  Chest one-view on August 25, 2008, showed satisfactory appearance of a  left-sided PICC line.  Stable chest.  Portable right femur on August 24, 2008, showed satisfactory appearance of the lateral side plate fixation  of a periprosthetic fracture with good anatomic alignment.  Portable  chest done on August 21, 2008, showed stable cardiomegaly and mild  vascular congestion, chronic changes noted.   Cardiology:  EKG on August 25, 2008, showed normal sinus rhythm with ST  and T-wave abnormalities, prolonged QT.   HOSPITAL COURSE:  The patient admitted to the hospital.  Surgery was  delayed due to cardiac issues.  [facility], [facility] managed her cardiac conditions.  Orthopedically, she remained  stable.  She was just touchdown weightbearing on this right lower  extremity.  She did have acute blood loss anemia and was transfused 3  units slowly on August 27, 2008.  Afterwards, she remained stable the  next day.  She is afebrile.  Dressing was changed with no significant  drainage from the wound.  Cardiology saw the patient on August 27, 2008,  with recommendations to continue the same medical regimen that she was  preoperatively.  Dr. Charlann Boxer on August 28, 2008, had an extensive  conversation about smoking cessation.  We will allow for a nicotine  patch while in rehab facility as noted in the discharge instructions.  She will remain touchdown weightbearing for 6 weeks.  We will follow up  back up with her in a couple weeks.   DISCHARGE DISPOSITION:  Discharge to skilled nursing facility rehab in  stable and improved condition.  DISCHARGE DIET:  Heart-healthy.   DISCHARGE WOUND CARE:  Keep dry.   DISCHARGE MEDICATIONS:  1. Nicotine patch 14 mg q.24 h., x2 weeks.  2. Norco 7.5/325 one-to-two p.o. q.4-6 p.r.n. pain.  3. Robaxin 500 mg 1 p.o. q.6 p.r.n. muscle spasm pain.  4. Vytorin 10/40 one p.o. nightly.  5. Plavix 75 mg 1 p.o. daily.  6. Atenolol 25 mg 1 p.o. b.i.d.  7. Furosemide 40 mg 1 p.o. b.i.d.  8. Cymbalta 60 mg 1 p.o. nightly.  9. Albuterol inhaler p.r.n.  10.Nitro p.r.n.  11.Vitamin B12 1000 mcg injection q. monthly.  12.Bentyl 20 mg p.r.n.  13.Nexium 40 mg 1 p.o. daily.  14.Celebrex 200 mg 1 p.o. daily p.r.n.  15.Laxative of choice p.r.n.  16.Aspirin enteric-coated 325 mg p.o. daily.  17.Librium 25 mg 1 p.o. t.i.d..   DISCHARGE SPECIAL INSTRUCTIONS:  1. No  smoking.  2. Physical therapy, she is touchdown weightbearing on this right      lower extremity x6 weeks.  She is high fall      risk.  3. Follow-up with Dr. Charlann Boxer at phone number [phone] in 2 weeks for      wound check.  4. Any cardiac issues, follow up with [facility].     ______________________________  Yetta Glassman. [address], [place]      Madlyn Frankel. Charlann Boxer, M.D.  Electronically Signed    BLM/MEDQ  D:  08/28/2008  T:  08/28/2008  Job:  [id number]   cc:   Gerlene Burdock A. Alanda Amass, M.D.  Fax: [phone]

## 2010-09-20 NOTE — H&P (Signed)
[name], [name]               ACCOUNT NO.:  [id number]   MEDICAL RECORD NO.:  [id number]          PATIENT TYPE:  INP   LOCATION:  NA                           FACILITY:  [facility]   PHYSICIAN:  Madlyn Frankel. Charlann Boxer, M.D.  DATE OF BIRTH:  1946/03/04   DATE OF ADMISSION:  08/20/2007  DATE OF DISCHARGE:                              HISTORY & PHYSICAL   PROCEDURE:  Left total hip arthroplasty.   CHIEF COMPLAINT:  Left hip pain.   HISTORY OF PRESENT ILLNESS:  A 65 year old female with a history of left  hip pain secondary to osteoarthritis.  It has been refractory to all  conservative treatment.  Presurgical assessed by her primary care  physician, Tally Joe, M.D. and cardiologist Aram Candela. Tysinger, MD.   PAST MEDICAL HISTORY:  Significant for:  1. Osteoarthritis.  2. Depression, anxiety.  3. Stomach cancer.  4. Breast cancer.  5. Hypertension.  6. Asthma.  7. Congestive heart failure.  8. Coronary artery disease.  9. Osteoporosis.  10.Lipidemia.  11.Phlebitis.  12.GERD.  13.Anemia.   PAST SURGICAL HISTORY:  1. Right kidney surgery.  2. Triple bypass surgery.  3. Cancer x3.  4. Cholecystectomy.  5. Appendectomy.   The patient's primary caregiver after surgery will be SNF placement  afterwards.   FAMILY HISTORY:  Coronary artery disease, Alzheimer's, cancer.   DRUG ALLERGIES:  NO KNOWN DRUG ALLERGIES.   PREVIOUS DRUG ALLERGIES:  AS STATED DURAGESIC, BUT SHE IS CURRENTLY ON  DURAGESIC, AS WELL AS STATES CODEINE WITH UNKNOWN SIDE EFFECTS.  VERIFIED DRUG ALLERGIES WITH THE PATIENT.   CURRENT MEDICATIONS:  Unknown dosage and frequency verified with patient  and include:  1. Librium.  2. Bentyl.  3. Plavix.  4. B12 shots.  5. Duragesic.  6. Vytorin.  7. Naproxen.  8. Flector patches.  9. Cymbalta.  10.Ultram.  11.Atenolol.   REVIEW OF SYSTEMS:  Last EKG was 2 weeks ago.  Otherwise, no recent  health changes, see HPI.   PHYSICAL EXAMINATION:  A 64 pulse,  respirations 18, blood pressure  88/60.  GENERAL:  Awake, alert and oriented, well-developed, well-nourished in  no acute distress.  NECK:  Supple.  No carotid bruits.  CHEST/LUNGS:  Clear to auscultation bilaterally.  BREASTS:  Deferred.  HEART:  Regular rate and rhythm.  S1-S2 distinct.  ABDOMEN:  Soft, bowel sounds present.  GENITOURINARY:  Deferred.  EXTREMITIES:  Left hip has increased pain with range of motion.  SKIN:  No cellulitis.  NEUROLOGIC:  Intact distal sensibilities.   Her labs, EKG, chest x-ray all pending presurgical testing.   IMPRESSION:  Left hip osteoarthritis.   PLAN OF ACTION:  Left total hip arthroplasty, August 20, 2007, [facility] by surgeon Dr. Durene Romans.  Risks and complications were  discussed.  Planned disposition will be to SNF postoperatively.  No  postoperative medications given at time of history and physical.     ______________________________  Yetta Glassman. [address], [place]      Madlyn Frankel. Charlann Boxer, M.D.  Electronically Signed    BLM/MEDQ  D:  08/19/2007  T:  08/19/2007  Job:  [id number]   cc:   Antionette Char, MD  Fax: [phone]   Tally Joe, M.D.  Fax: [phone]

## 2010-09-20 NOTE — Discharge Summary (Signed)
[name], [name]               ACCOUNT NO.:  [id number]   MEDICAL RECORD NO.:  [id number]          PATIENT TYPE:  INP   LOCATION:  1602                         FACILITY:  [facility]   PHYSICIAN:  [name]. [name], M.D.  DATE OF BIRTH:  [birth year]   DATE OF ADMISSION:  04/06/2008  DATE OF DISCHARGE:  04/13/2008                               DISCHARGE SUMMARY   ADMITTING DIAGNOSES:  1. Osteoarthritis.  2. Depression, anxiety.  3. Stomach cancer.  4. Breast cancer.  5. Hypertension.  6. Asthma.  7. Congestive heart failure.  8. Coronary artery disease.  9. Osteoporosis.  10.Hyperlipidemia.  11.Phlebitis.  12.Gastroesophageal reflux disease.  13.Anemia.   DISCHARGE DIAGNOSES:  1. Osteoarthritis.  2. Depression anxiety.  3. Stomach cancer.  4. Breast cancer.  5. Hypertension.  6. Asthma.  7. Congestive heart failure.  8. Coronary artery disease.  9. Osteoporosis.  10.Hyperlipidemia.  11.Gastroesophageal reflux disease.  12.Phlebitis.  13.Anemia.  14.Postoperative hypokalemia.  15.Postoperative hyponatremia.   HISTORY OF PRESENT ILLNESS:  A 65 year old female with a history of  right hip pain secondary to osteoarthritis.  It was refractory to all  conservative treatment.  She underwent right total hip replacement.   CONSULTANTS:  None.   PROCEDURE:  1. Right total hip replacement performed by surgeon, Dr. Durene Chang      Assistant, [name].  This original total hip replacement was      performed on April 06, 2008.  2. A revision right total hip replacement with open reduction and      internal fixation of the right femur as she had a periprosthetic      femur fracture.  The surgeon was [name], assistant was [name], [name].   LABORATORY DATA:  Final CBC reading showed her white blood cells 8.4,  hemoglobin 10.8, hematocrit 32.4, and platelets 209,000.  Final  metabolic:  Sodium 131, potassium 3.9, BUN 8, creatinine 0.68, and  glucose 105.  Calcium was  8.  Ionized calcium was 1.11.  BNP was 126.   RADIOLOGY:  1. Portable pelvis showed status post postoperative primary right      total hip replacement.  2. Portable pelvis wire fixation of periprosthetic proximal femoral      shaft fracture.  3. Portable right hip showed anatomic alignment status post right hip      arthroplasty revision.  4. A right femur, two-view, on the 2nd showed a spiral fracture      extended to 5 cm above the distal tip of the right femoral      hardware, both varus and posterior angulation of the distal      fragment.  5. Chest, two-view, showed no acute cardiopulmonary process.   CARDIOLOGY:  EKG showed normal sinus rhythm.   HOSPITAL COURSE:  The patient was admitted to the hospital for her  primary total hip replacement.  She underwent this procedure on April 06, 2008.  She was admitted to the Orthopedic floor.  She had  significant increasing confusion.  She did have a mild drop in her  sodium.  She was planned SNF placement at discharge.  On the second at  1225, the patient was coming from room, was found lying on the floor.  Vitals were taken, she was stable.  Surgical site was checked, dressing  was dry.  She was placed at the nurse's station as for closer watch and  observation.  MD was notified, restraints were ordered for use p.r.n.  for the patient's safety and the family was notified.   When we saw her on the second morning, she still continued to be  confused.  She was transfused 2 units of blood due to acute blood loss  anemia as well as metabolic imbalances.  The patient was very lethargic  when Physical Therapy came by.  Later on in the day on the 2nd, the  patient was found in the room lying on the floor.  She had a little bit  of blood loss from the hip.  The patient had significant pain with any  movement of her right hip and her right leg was externally rotated.  X-  ray was ordered which revealed a periprosthetic fracture.  Family  was  attempted to call multiple times on the phone.  The patient at this  point was placed in the bed with decubitus precautions.  We saw her on  the 3rd.  She did have decreased confusion, but she was in extreme pain.  We repeated the labs and planned for surgery that evening.  Revision  right total hip place was performed on the 3rd by Dr. Charlann Chang and [name].  X-ray showed satisfactory placement anatomic alignment postoperatively.   Seen on the 4th.  She was in pain, but doing okay.  Afebrile.  She was  touchdown weightbearing.  Over the weekend, she remained stable, pain  was controlled.  She made limited progress with physical therapy.  Seen  on the 7th.  She was doing better, a little bit more alert.  She was  postop day #4 status post revision.  No new labs.  She had not have a  bowel movement, so we added some Senokot.  Otherwise, she was stable  hemodynamically, orthopedically was stable and touchdown weightbearing  status, and was ready for discharge to SNF Rehab upon positive bowel  movement.   DISCHARGE DISPOSITION:  Discharged to skilled nurse facility rehab in  stable condition.   DISCHARGE DIET:  Heart-healthy.   DISCHARGE WOUND CARE:  Keep wound dry change dressing on a daily basis.   DISCHARGE MEDICATIONS:  1. Lovenox 40 mg subcu q.24 h. x10 days.  2. Then restart Plavix when completed 75 mg 1 p.o. daily as she was      previously taking.  3. Robaxin 500 mg 1 p.o. q.6 h.  4. Norco 7.5/325 one to two p.o. q.4-6 h. p.r.n. pain.  5. Iron 325 mg 1 p.o. t.i.d. x3 weeks.  6. Colace 100 mg 1 p.o. t.i.d. p.r.n. constipation while on narcotics.  7. MiraLax 70 grams 1 p.o. daily p.r.n. constipation while on      narcotics.  8. Senokot-S 1-2 p.o. b.i.d. p.r.n. constipation.  9. If Colace and MiraLax fail, they can give her enema of choice,      laxative of choice, whatever.  10.Nicotine patch 40 mg q.24 h.  11.Vytorin 10/40 one p.o. q.p.m.  12.Atenolol 25 mg p.o. b.i.d.   13.Furosemide 40 mg p.o. b.i.d.  14.Dicyclomine 20 mg p.o. t.i.d.  15.Chlordiazepoxide p.o. t.i.d.  16.Cymbalta 60 mg p.o. q.h.s.  17.Xanax 0.5 mg 1 p.o. q.6 h. p.r.n. anxiety and agitation.   DISCHARGE FOLLOWUP:  Follow with Dr. Charlann Chang at phone number [phone] in 2  weeks for wound check.     ______________________________  Yetta Glassman. Loreta Ave, [name]      [name]. [name], M.D.  Electronically Signed    BLM/MEDQ  D:  04/13/2008  T:  04/13/2008  Job:  [id number]   cc:   Antionette Char, MD  Fax: [phone]   Tally Joe, M.D.  Fax: [phone]

## 2010-09-20 NOTE — Discharge Summary (Signed)
[name], [name]               ACCOUNT NO.:  [id number]   MEDICAL RECORD NO.:  [id number]          PATIENT TYPE:  INP   LOCATION:  1608                         FACILITY:  [facility]   PHYSICIAN:  Madlyn Frankel. Charlann Boxer, M.D.  DATE OF BIRTH:  [birth year]   DATE OF ADMISSION:  08/20/2007  DATE OF DISCHARGE:  08/23/2007                               DISCHARGE SUMMARY   ADMITTING DIAGNOSES:  1. Osteoarthritis.  2. Depression/anxiety.  3 . Stomach cancer.  1. Breast cancer.  2. Hypertension.  3. Asthma.  4. Congestive heart failure.  5. Coronary artery disease.  6. Osteoporosis.  7. Hyperlipidemia.  8. Phlebitis.  9. GERD.  10.Anemia.   DISCHARGE DIAGNOSES:  1. Osteoarthritis.  2. Depression/anxiety.  3  Stomach cancer.  1. Breast cancer.  2. Hypertension.  3. Asthma.  4. Congestive heart failure.  5. Coronary artery disease.  6. Osteoporosis.  7. Hyperlipidemia.  8. Phlebitis.  9. GERD.  10.Anemia.  11.Acute blood loss anemia.  12.Postoperative hypokalemia.   HISTORY OF PRESENT ILLNESS:  [name] is a 65 year old female with a  history of left hip pain secondary to osteoarthritis.  It was refractory  to all conservative treatment.  She was presurgically assessed for a  left total hip replacement and underwent surgery for said disease.   CONSULTANT:  None.   PROCEDURE:  Left total hip arthroplasty by surgeon Dr. Durene Romans.  Assistant [employer] PA-C.   LABORATORY DATA:  CBC preadmission:  Hemoglobin 0.7, hematocrit 33.2,  platelets 261.  Tracked throughout her course of stay.  She did have  some acute blood loss anemia with hemoglobin falling to 7.9.  Was  transfused 2 units.  At time of discharge, her hemoglobin/hematocrit  respectively was 11 and 32.8.  C-met preadmission showed her sodium 131,  potassium 4.1, her glucose 90.  Her creatinine 1.54.  Tracked throughout  her course of stay.  At time of discharge, her sodium was 130, potassium  3.9, creatinine was 0.94.   Preadmission UA was negative for nitrites.  Checked again during the course of stay, it did show moderate  leukocytes, negative for nitrites, but did show 3-6 white blood cells,  rare bacteria.   RADIOLOGY:  Chest x-ray report not done on chart.  Last portable chest  from 2007 showed no acute disease, COPD changes.  Portable pelvis  postoperatively showed satisfactory appearance of the left total hip  arthroplasty.   HOSPITAL COURSE:  The patient went left total hip replacement tolerated  procedure well and was admitted to orthopedic floor.  She did remain  afebrile throughout the course stay.  She did have some increased  confusion while on the floor.  This was moderated by her pain medicines  which were adjusted accordingly.  She was given some Benadryl at the  time and was extremely groggy.  Otherwise, remained alert, although  mildly confused.  She was improved prior to discharge.  Her dressing was  changed after day two.  Wound was dry, no drainage.  No sign of  infection, looked great.  She was transfused some blood on day  two.  Neurovascularly, she remained intact.  She did progress moderately with  physical therapy, was able to ambulate  at 1.66 feet.  Still, I needed  some further progress to be able to live independently though she does  live alone.  Last morning when I saw her, she did have some increased  rhonchi of her lungs with a known history of asthma and some COPD  changes.  She was given some Xopenex which helped moderate the  breathing, and she felt to have an improved breathing process.   DISPOSITION:  Discharge to skilled nursing facility rehab, stable  improved condition.   DISCHARGE INSTRUCTIONS:  1. Discharged physical therapy.  She is weightbearing as tolerated      with the use of rolling walker with transition to single point cane      at 2-3 weeks postoperatively, depending upon progress.  2. Diet:  Regular heart healthy.  3. Wound care:  Keep wound  dry, change dressing on a daily basis.  Do      not apply Vaseline around wound or cover with any kind of Adaptic      dressing as Vaseline will remove the Dermabond suture.   MEDICATIONS:  Verify all home medicines with the MAR.  Home medicines  include:  1 . Atenolol 25 mg p.o. b.i.d.  1. Nexium.  2. Librium 25 mg p.o. t.i.d.  3. Bentyl 20 mg p.o. b.i.d.  4. Cymbalta 60 mg p.o. q.h.s.  5. Vytorin one tablet p.o. daily.  6. Lasix 40 mg p.o. b.i.d.  7. Xopenex 0.63 mg nebulizer one p.o. q.6 p.r.n. for shortness of      breath or exacerbation of asthma.  8. Lisinopril 10 mg one p.o. daily.  9. Nicotine patch 14 mg transdermal one a day p.r.n.  10.Duragesic 25 mcg transdermal patch, change out every 3 days, last      application was on April 14.   DISCHARGE MEDICATIONS:  1. Lovenox 40 mg subcu q. 24 times 11 days.  2. Enteric-coated aspirin 325 mg one p.o. daily x4 weeks after Lovenox      is completed.  3. Robaxin 500 mg one p.o. q.6 h muscle spasm pain p.r.n.  4. Colace 100 mg p.o. b.i.d. p.r.n. constipation.  5. MiraLax 17 grams p.o. daily constipation.  6. Senokot 1-2 tablets p.o. b.i.d. p.r.n.  7. Enema of choice, laxative of choice.  8. Vicodin 5/325 one to two p.o. q. 4-6 p.r.n., if unable to tolerate      then just Tylenol 650-975 mg p.o. q.6 h p.r.n. pain.  Do not give      the same time as Vicodin.   DISCHARGE FOLLOWUP:  Follow up with Dr. Charlann Boxer at [phone] in 10-14 days  for wound check.     ______________________________  Yetta Glassman. [address]oreta Ave, [place]      Madlyn Frankel. Charlann Boxer, M.D.  Electronically Signed    BLM/MEDQ  D:  08/23/2007  T:  08/23/2007  Job:  [id number]

## 2010-09-20 NOTE — Op Note (Signed)
NAMEKANSAS, SPAINHOWER NO.:  [id number]   MEDICAL RECORD NO.:  [id number]          PATIENT TYPE:  INP   LOCATION:  0098                         FACILITY:  [facility]   PHYSICIAN:  Madlyn Frankel. Charlann Boxer, M.D.  DATE OF BIRTH:  1946-04-18   DATE OF PROCEDURE:  09/15/2008  DATE OF DISCHARGE:                               OPERATIVE REPORT   PREOPERATIVE DIAGNOSIS:  Right lateral superficial hip hematoma  following the open reduction and subluxation of a right periprosthetic  fracture approximately 18 days ago.   POSTOPERATIVE DIAGNOSIS:  Right lateral superficial hip hematoma  following the open reduction and subluxation of a right periprosthetic  fracture approximately 18 days ago.   PROCEDURE:  Incision and drainage and debridement of right thigh  hematoma.   SURGEON:  Madlyn Frankel. Charlann Boxer, M.D.   ASSISTANT:  None.   ANESTHESIA:  General.   SPECIMENS:  I did the send this lateral joint fluid which appeared be  more of a hematoma for a stat Gram stain and culture.  The results are  pending.   COMPLICATIONS:  None.   One Hemovac.   BLOOD LOSS:  Minimal.   INDICATIONS FOR PROCEDURE:  Ms. Sullivant is a 65 year old female, history  of a right total hip replacement, on account of about 2 periprosthetic  fractures.  She had been on DVT prophylaxis including her Plavix and  aspirin.  She developed some swelling in the lateral thigh and with the  drain was noted approximately 2 days prior to the procedure and was  subsequently noted for observation.  She was placed on vancomycin.  At  the time of the admission on Sep 13, 2008, I discussed with her that I  think, given her current situation, it is best to go ahead and remove  this hematoma to prevent any potential culture medium for bacteria with  minimal risks from the surgical standpoint, other than that of  anesthesia.  Consent was obtained for the benefit of removing the blood.   PROCEDURE IN DETAIL:  The patient was brought  to operative theater.  Once adequate anesthesia and preoperative antibiotics had already been  administered  with vancomycin started, she was positioned into the left  lateral decubitus position with the right-side up.  The right lower  extremity was then prescrubbed, prepped and draped in a sterile fashion.  Time-out was performed identifying the patient, planned procedure and  the extremity.  I made an incision around a small area.  It appeared to  be a prominent suture and then also about a 4-cm elliptical incision,  excising the old scar.  With this came a rush of hematoma fluid.  Cultures were obtained at the time.  Following the evacuation of the  hematoma and culture obtainment, I debrided the soft tissues down in  this area.  I then used 3 L of normal saline solution with pulse lavage  and irrigated this area.  There did not appear to be any palpable or  visible defects in the fascia.  In addition, the wound all appeared to  be superficial with no evidence of  any fluctuance deep.   Following the irrigation with 3 L of normal saline solution, I  reapproximated the wounds with 2-0 Vicryl and staples on the skin.  I  did place a medium Hemovac drain deep to the skin in the subcutaneous  pocket to help try to provide suction, as she appeared to have not yet  fully healed this subcutaneous fat layer down to the fascial layer.   The wounds were dressed in sterile with Mepilex dressings.  She was  brought into the recovery room, extubated in stable condition, tolerated  the procedure well.   POSTOP PLAN:  Would include the fact that I have her on IV vancomycin  through her hospital stay, considering switching her over to an oral  medication like doxycycline for long-term prophylaxis just to make sure  that we are certain there is no infection of this hip.      Madlyn Frankel Charlann Boxer, M.D.  Electronically Signed     MDO/MEDQ  D:  09/15/2008  T:  09/15/2008  Job:  [id number]

## 2010-09-20 NOTE — Op Note (Signed)
NAMEKIMIAH, [name]               ACCOUNT NO.:  [id number]   MEDICAL RECORD NO.:  [id number]          PATIENT TYPE:  INP   LOCATION:  0003                         FACILITY:  [facility]   PHYSICIAN:  Madlyn Frankel. Charlann Boxer, M.D.  DATE OF BIRTH:  07-27-1945   DATE OF PROCEDURE:  08/20/2007  DATE OF DISCHARGE:                               OPERATIVE REPORT   PREOPERATIVE DIAGNOSIS:  Left hip osteoarthritis.   POSTOPERATIVE DIAGNOSIS:  Left hip osteoarthritis.   PROCEDURE:  Left total hip replacement.   COMPONENTS USED:  DePuy hip system, size 56 pinnacle cup, 36 +4 neutral  Ultrex liner, a 5 high Tri-Lock stem with a 36 +5 ceramic ball.   SURGEON:  Madlyn Frankel. Charlann Boxer, M.D.   ASSISTANT:  Yetta Glassman. Mann, PA.   ANESTHESIA:  General.   BLOOD LOSS:  400 mL.   DRAINS:  One.   COMPLICATIONS:  None.   INDICATIONS FOR PROCEDURE:  [name] is a 65 year old female who  presented office with left sided symptoms.  Workup consisted of  evaluation of ruling out sciatic complaints versus hip complaints.  Intra-articular injection provided the most significant relief, and for  that reason, she wished to proceed with hip replacement surgery after  reviewing risks and benefits of infection, component failure, the need  for revision surgery, persistent pain postop, as well as dislocation.  Consent was obtained for the benefit of pain relief.   PROCEDURE IN DETAIL:  The patient was brought to the operative theater.  Once adequate anesthesia and preoperative antibiotics, Ancef,  administered, the patient was positioned in the right lateral decubitus  position with left side up and bony prominences padded.  The left lower  extremity was then pre-scrubbed and prepped and draped in a sterile  fashion, exposing the hip from the crest to the proximal thigh.  A  lateral based incision was made exposing the iliotibial band and gluteus  fascia which was incised posteriorly for a posterior approach to the  hip.   The short external rotators were taken down separate from the  posterior capsule.  An L capsulotomy was made preserving the posterior  leaflet for protection of the sciatic nerve against retractors.  The hip  was dislocated.  Based on anatomic landmarks and preoperative templating  and based on the relatively short femoral neck, a neck osteotomy was  made.  Attention was first directed to the femur with femoral exposure  obtained.  Retractors placed.  I used a box osteotome to assure  laterality.  I then used a starting drill.  A hand reamer was passed in  the femur once, then irrigated to prevent fat emboli.  I broached with a  size 1, setting my anteversion at 20-25 degrees.  I then broached up to  a size 5, which had an excellent fit with exposed cortical bone along  the medial calcar of the femur.  I went ahead and packed off the femur  now and attended to the acetabular.  Acetabular exposure was obtained  and a labrectomy carried out.  With this exposure, I began reaming with  a  43 reamer down to the medial wall and then reamed up to a 55 reamer,  which at that point I had preserved posterior wall and anatomy, in  addition to exposed cancellous bone.   Given this, I chose to use a 56 pinnacle cup.  The 56 pinnacle cup was  impacted and well seated within the acetabulum at approximately 20  degrees of forward flexion and 40 degrees of abduction using the hip  guide to assess position.  Two cancellous screws were placed to aid in  the initial scratch fit.  I placed a 36 +4 neutral liner, Ultrex.  Now a  trial reduction was carried out with this trial liner placed a 5 broach  in place.  Initially, I used the standard neck with a +1.5 ball, but  felt there was some dural impingement.  For that reason, I used a high  offset neck as a trial.  With the 5 high neck in place, the hip was very  stable, tolerating hip flexion and internal rotation to 80 degrees.  No  evidence of impingement  with external rotation in abduction or  extension.  The leg lengths appeared to be equal to the down leg  compared to preoperative position.  At this point, I went ahead and  removed all trial components.  The central hole eliminator was placed in  the acetabular shell and the final 36+ 4 Ultrex liner was impacted in  position without difficulty or impingement.  The final 5 high offset Tri-  Lock stem was then impacted at the level where the broach was.  Based on  some shuck that I had noted with the +1.5 ball in place of a trial, I  trialed with a +5 ball.  This limited the shuck to about a mm and did  not necessarily effect my leg length that much, and the hip stability  remained the same.  For this reason, I used a 36 +5 Delta ceramic ball  and impacted it onto a clean and dry trunnion.  The hip was irrigated  throughout the case and again at this point.  Once all the bony surfaces  were filled with components, the bleeding essentially stopped.   A medium Hemovac drain was placed deep.  The posterior capsule was  unable be reapproximated to the superior leaflet due to superior  debridement and acetabular exposure to prevent impingement, I removed a  portion of the posterior capsule.   The iliotibial band and gluteal fascia were reapproximated using #1  Vicryl.  The remaining wound was closed with 2-0 Vicryl and 4-0  Monocryl.  Dermabond was applied to the skin followed by Adaptic and  Mepilex dressing.  The patient's hip was cleaned, dried and dressed  sterilely.  The patient was brought to the recovery room in stable  condition having tolerating procedure well.      Madlyn Frankel Charlann Boxer, M.D.  Electronically Signed     MDO/MEDQ  D:  08/20/2007  T:  08/20/2007  Job:  [id number]

## 2010-09-20 NOTE — Procedures (Signed)
DUPLEX DEEP VENOUS EXAM - UPPER EXTREMITY   INDICATION:  Right arm edema.   HISTORY:  Edema:  Yes.  Trauma/Surgery:  No.  Pain:  No.  PE:  No.  Previous DVT:  No.  Anticoagulants:  No.  Other:  No.   DUPLEX EXAM:                                             Bas/                IJV   SCV     AXV    BrachV  Ceph V                R  L  R   L   R  L   R   L   R  L  Thrombosis    0  0  0   0   0      0       0  Spontaneous   +  +  +   +   +      +       +  Phasic        +  +  +   +   +      +       +  Augmentation  +  +  +   +   +      +       +  Compressible  +  +  +   +   +      +       +  Competent     +  +  +   +   +      +       +  Legend:  + - yes  o - no  p - partial  D - decreased   IMPRESSION:  There does not appear to be any thrombus noted in the right  upper extremity.  The right ulnar veins were not imaged due to the edema  in the arm.   ___________________________________________  Quita Skye. Hart Rochester, M.D.   CB/MEDQ  D:  06/08/2009  T:  06/09/2009  Job:  [id number]

## 2010-09-20 NOTE — Discharge Summary (Signed)
NAMEMARILIN, KOFMAN               ACCOUNT NO.:  [id number]   MEDICAL RECORD NO.:  [id number]          PATIENT TYPE:  INP   LOCATION:  1602                         FACILITY:  [facility]   PHYSICIAN:  Madlyn Frankel. Charlann Boxer, M.D.  DATE OF BIRTH:  December 30, 1945   DATE OF ADMISSION:  04/06/2008  DATE OF DISCHARGE:                               DISCHARGE SUMMARY   ADDENDUM:  She remained in the hospital an additional day.  She had a  bowel movement.  She continued to be touch-down weightbearing for  physical therapy.  No other acute changes were made.  She was still  stable and ready for discharge to skilled nursing facility rehab.  Touch-  down weightbearing 25% of the right lower extremity.     ______________________________  Yetta Glassman [address]ta [address], [place]      Madlyn Frankel. Charlann Boxer, M.D.  Electronically Signed    BLM/MEDQ  D:  04/14/2008  T:  04/14/2008  Job:  [id number]

## 2010-09-23 NOTE — Cardiovascular Report (Signed)
NAMEFINLAY, MILLS               ACCOUNT NO.:  [id number]   MEDICAL RECORD NO.:  [id number]          PATIENT TYPE:  OIB   LOCATION:  3302                         FACILITY:  [facility]   PHYSICIAN:  Darlin Priestly, MD  DATE OF BIRTH:  Apr 20, 1946   DATE OF PROCEDURE:  09/26/2005  DATE OF DISCHARGE:                              CARDIAC CATHETERIZATION   PROCEDURES:  1.  Coronary angiography.  2.  Saphenous vein graft.  3.  Saphenous vein graft to posterior descending artery.  4.  Percutaneous transluminal coronary balloon angioplasty.   ATTENDING:  Darlin Priestly, M.D.   COMPLICATIONS:  None.   INDICATIONS:  Ms. [name] is a 65 year old female patient of Dr. Charolette Child with a history of hypertension, CAD, status post coronary bypass  surgery in 1993, with subsequent PCI.  She recently underwent cardiac  catheterization after complaining of increased chest pain by Dr. Charolette Child on Sep 20, 2005, which revealed diffuse three-vessel CAD.  The LIMA  to the LAD was patent.  There was a patent vein graft to the obtuse marginal  with no disease.  She did have a patent vein graft to the PDA, although the  entire mid and distal portion of the graft was diffusely diseased up to 99%  with probable thrombus in the distal portion of the graft.  She is now  brought for attempt at PCI of the vein graft to the RCA.   DESCRIPTION OF PROCEDURE:  After obtaining informed consent, the patient was  brought to the cardiac catheterization lab.  The right groin was shaved,  prepped and draped in the sterile fashion.  ECG monitor was established.  Under modified Seldinger technique, a #6 French arterial sheath inserted  into the right femoral artery.  A #6 [place], RCB guiding catheter with side  holes was acquired and engaged to the vein graft to the RCA.  This was noted  to be totally occluded at its ostium which was new from Sep 20, 2005.   The guide was then used to selectively engage the  vein graft to the OM which  appeared widely patent.  There were noted to be collaterals to the distal PD  and posterolateral branch from the circumflex system.   We also engaged the native right coronary which was a small vessel which was  diffusely diseased.   Discussion with Dr. Aleen Campi in attempt at PCI of the vein graft to the RCA  was performed.   The vein graft to the RCA was then selected again with the RCB guiding  catheter side holes and a 0.14 Forte guide wire was advanced through a  guiding catheter.  We were unable to advance this beyond the proximal  portion of the graft.  This wire was in exchange for a PT2 guide wire which  was able to successfully across the proximal stenotic lesion and positioned  to the distal PDA without difficulty.  We then took a Maverick 3.0 x 20 mm  balloon into the distal portion of the vein graft and then multiple  inflations were then performed throughout  the distal mid and proximal  portion of the graft up to 6 atmospheres for a total of approximately 2  minutes.  Followup angiogram revealed TIMI-1 flow into the proximal portion  of the graft.  However, the patient then complained of chest pain and there  appeared to be a proximal dissection in the graft with some concern this may  be subintimal.  Multiple orthogonal views were obtained and failed to reveal  any evidence of perforation, although there was a persistent dissection in  the graft with ongoing chest pain.  Because of my concern that this may  represent subintimal tear with the possibility of a perforation, we  ultimately abandoned the procedure.  At the conclusion of the case after  approximately 20 minutes of observing the vein graft, there was no evidence  of perforation.  The guide wire was then removed.  All catheters were then  removed.  The patient returned to the recovery room in stable condition.   CONCLUSION:  1.  Unsuccessful percutaneous coronary intervention of the  total occluded      vein graft to the PDA.  2.  Patent vein graft to the obtuse marginal with no significant disease      beyond the graft insertion.  There is left to right collaterals to the      distal right coronary artery.  3.  Diffusely diseased, small right coronary artery.  4.  Systemic hypertension.      Darlin Priestly, MD  Electronically Signed     RHM/MEDQ  D:  09/26/2005  T:  09/26/2005  Job:  [id number]   cc:   Antionette Char, MD  Fax: [phone]

## 2010-09-23 NOTE — Discharge Summary (Signed)
[name], [name]               ACCOUNT NO.:  [id number]   MEDICAL RECORD NO.:  [id number]          PATIENT TYPE:  OBV   LOCATION:  2023                         FACILITY:  [facility]   PHYSICIAN:  Antionette Char, MD    DATE OF BIRTH:  06/15/1945   DATE OF ADMISSION:  11/19/2008  DATE OF DISCHARGE:  11/20/2008                               DISCHARGE SUMMARY   DISCHARGE DIAGNOSES:  1. Unstable angina, negative myocardial infarction and negative      nuclear stress test.  2. History of coronary disease with bypass grafting in 1993 and      interventions in 2007 unsuccessfully to the saphenous vein graft to      the posterior descending artery.  3. History of hypertension.  4. Dyslipidemia.   DISCHARGE CONDITION:  Improved.   PROCEDURES:  None.   DISCHARGE MEDICATIONS:  1. Neurontin 300 mg 3 times a day.  2. Cymbalta 60 mg at bedtime.  3. Chlordiazepoxide 25 mg at bedtime.  4. Vytorin 10/40 one at bedtime.  5. Bayer Aspirin 325 mg daily.  6. Darvocet 1-2 every 8-12 hours as needed.  7. Atenolol 25 mg twice a day.  8. Levothyroxine 25 mcg daily.  9. Vitamin B12, i.e. cyanocobalamin 1000 mcg injection once monthly.  10.Plavix 75 mg daily.  11.Vitamin D 50,000 units weekly.  12.Caltrate 600 plus D daily.  13.Furosemide 40 mg, we increased from twice a day to just once a day.  14.Nitroglycerin 0.4 mg sublingual 1 every 5 minutes x3 as needed for      chest pain.  15.Protonix 40 mg 1 daily and if the insurance company would not feel      okay for Prilosec 20 mg over-the-counter.  16.Imdur 30 mg daily.   DISCHARGE INSTRUCTIONS:  1. Low-sodium heart-healthy diet.  2. Increase activity slowly.  3. Follow up with Dr. Aleen Campi or Moshe Salisbury next week.  Call      office for date and time.   HISTORY OF PRESENT ILLNESS AND HOSPITAL COURSE:  A 65 year old white  female with history of coronary disease with bypass grafting in 1993 has  undergone PCI though it had failed and history  of hypertension, heart  failure, dyslipidemia received from [facility] secondary to chest  pain.  The evening prior to admission while watching television, she  began to experience chest pressure and a funny feeling around her  heart.  She was short of breath with nausea and mild diaphoresis.  She  thought it had been secondary to weaning off the Librium over the last 3  weeks.  Her discomfort did ease with sublingual nitro though.  She came  to the emergency room for further evaluation, had denied any exertional  chest pain, and has had multiple surgeries over the last 9 months prior  to this admission secondary to right hip and femur fracture.  She has  also been to rehab facility once in [place].  In the emergency room at  [facility], enzymes were negative and she still felt like she had a band  around her chest.   Discharged on  November 20, 2008 after nuclear study was negative for  ischemia.  There was a fixed defect consistent with scar within the apex  and apical mid segment of anterior wall.  No areas of reversibility to  suggest inducible ischemia and normal EF of 55%.  Cardiac enzymes were  all negative.   PAST MEDICAL HISTORY:  As stated some heart failure felt to be diastolic  dysfunction, dyslipidemia, osteoarthritis with bilateral hip  replacements, multiple hip and femur surgeries.  Also history of anxiety  and depression, history of stomach cancer, history of breast cancer with  bilateral mastectomies, history of gastroesophageal reflux disease, and  anemia.   ALLERGIES:  1. CODEINE.  2. DURAGESIC.  3. NARCAN  4. SULFA.   DISCHARGE PHYSICAL EXAMINATION:  VITAL SIGNS:  Blood pressure 156/78,  pulse 50, respirations 18, temperature 97.1, and oxygen saturation on  room air 95%.  HEART:  Regular rate and rhythm.  S1-S2.  LUNGS:  Clear.  ABDOMEN:  Soft and nontender.  Positive bowel sounds.  EXTREMITIES:  Without edema.   LABORATORY DATA:  CBC:  Hemoglobin 11.5,  hematocrit 33.8, WBC 5.2, and  platelets 241.   Basic metabolic panel:  Sodium 141, potassium 3.5, chloride 104, CO2 29,  glucose 97, BUN 9, creatinine 0.74, calcium 9, and magnesium level was  1.9.   Cardiac markers were negative with CK 38, MB 0.6, troponin I 0.02 and  follow up with the same.  Glycohemoglobin was 5.3.   TSH 3.014.   Total cholesterol 135 with triglycerides 179, HDL 30, and LDL 69.   PLAN:  Begin Protonix which we did for an outpatient.   The patient was seen examined by Dr. Lynnea Ferrier on-call for Dr. Aleen Campi  and once the stress test was negative, she was stable for discharge  home.  She will follow up as an outpatient.      [name]. Annie Paras, N.P.      Antionette Char, MD  Electronically Signed    LRI/MEDQ  D:  01/06/2009  T:  01/07/2009  Job:  [id number]

## 2010-09-23 NOTE — Op Note (Signed)
[facility]  Patient:    [name], [name] Visit Number: [id number] MRN: [id number]          Service Type: DSU Location: [facility] Attending Physician:  Consuello Bossier Dictated by:   Consuello Bossier., M.D. Proc. Date: 08/14/01 Admit Date:  08/14/2001                             Operative Report  PREOPERATIVE DIAGNOSIS:  Personal history of carcinoma of breast and mechanical complication of mammary prosthesis in patient with previous breast reconstruction including right latissimus dorsi myocutaneous flap and bilateral polyurethane foam-covered silicone gel breast reconstructions.  POSTOPERATIVE DIAGNOSIS:  Personal history of carcinoma of breast and mechanical complication of mammary prosthesis in patient with previous breast reconstruction including right latissimus dorsi myocutaneous flap and bilateral polyurethane foam-covered silicone gel breast reconstructions.  OPERATION PERFORMED:  Bilateral total capsulectomy with implant removal.  SURGEON:  Consuello Bossier., M.D.  ANESTHESIA:  General endotracheal.  OPERATIVE FINDINGS:  The patient had a longstanding history of having had a previous mastectomy for carcinoma of the breast followed by the above reconstructive procedures.  She had been noted on mammography and ultrasound to have an abnormal pattern in the left implant suggestive of rupture.  At surgery, this proved to be the case with some silicone gel bleed on the left which was not a great amount and no definite rupture of the implant.  On the right, there was no gel bleed although there was some darkly colored fluid contained in the capsule on the right in addition to what appeared to be an intact implant with minimal if any gel bleed.  DESCRIPTION OF PROCEDURE:  The patient was brought to the operating room and given a general endotracheal anesthetic and prepped with Betadine and draped about both breasts in sterile  fashion.  The previous right breast reconstruction side was approached by utilizing the previous lower aspect of the right latissimus dorsi myocutaneous flap.  The scar was excised and dissection continued down through the soft tissues until the anterior capsule was encountered.  The capsule was opened and there was this brownish fluid and some particulate matter which was removed.  The implant was removed and appeared to be intact.  At this point the capsule itself was collapsed and dissection continued around the entire periphery of the capsule removing the capsule completely.  Bleeding was controlled with an electrocautery unit.  The wound was irrigated with normal saline.  A 10 mm Blake drain was brought through a separate stab wound having been placed in this previous space.  The muscle was closed with interrupted #3-0 Vicryl.  The  subcutaneous closed with interrupted #3-0 Monocryl and the skin wound further closed with a running subcuticular #4-0 Monocryl.  The previous transverse incision on the left side was excised and dissection continued again down through the soft tissues until the anterior capsule was encountered.  Upon opening the capsule there was some free gel present but the implant appeared to be intact and appeared to be predominantly gel bleed.  In effect, the small amount of gel that was present was suctioned out and irrigated and then again the capsule collapsed and removed from its attachments in all directions performing complete total capsulotomy.  Again, hemostasis achieved and the wound irrigated with normal saline and the wound similarly closed after a Blake drain had been placed on the opposite side with muscle sutures of  interrupted #3-0 Vicryl followed by interrupted and running #4-0 Monocryl to the subcutaneous tissues and the skin.  Steri-Strips were applied.  This was followed by Xeroform, fluffs, ABD, and a circumthoracic Ace bandage.  The patient  tolerated the procedure well and was able to be discharged to the recovery room and subsequently to be admitted for overnight observation. Dictated by:   Consuello Bossier., M.D. Attending Physician:  Consuello Bossier DD:  08/14/01 TD:  08/14/01 Job: [id number]

## 2010-09-27 ENCOUNTER — Other Ambulatory Visit (HOSPITAL_COMMUNITY): Payer: Self-pay | Admitting: Urology

## 2010-09-27 DIAGNOSIS — D49519 Neoplasm of unspecified behavior of unspecified kidney: Secondary | ICD-10-CM

## 2010-10-19 ENCOUNTER — Ambulatory Visit (HOSPITAL_COMMUNITY)
Admission: RE | Admit: 2010-10-19 | Discharge: 2010-10-19 | Disposition: A | Discharge status: Home / Self Care | Payer: Medicare Other | Source: Ambulatory Visit | Attending: Urology | Admitting: Urology

## 2010-10-19 DIAGNOSIS — D49519 Neoplasm of unspecified behavior of unspecified kidney: Secondary | ICD-10-CM

## 2010-10-19 DIAGNOSIS — N281 Cyst of kidney, acquired: Secondary | ICD-10-CM | POA: Insufficient documentation

## 2010-10-19 MED ORDER — GADOBENATE DIMEGLUMINE 529 MG/ML IV SOLN
15.0000 mL | Freq: Once | INTRAVENOUS | Status: AC | PRN
  Administered 2010-10-19: 15 mL via INTRAVENOUS

## 2010-12-22 IMAGING — CT CT HEAD W/O CM
1 of 2 series · 16 of 30 positions shown, 20 images · non-contrast
Comparison: None

CLINICAL DATA: Altered mental status.  Fall.  Headache.

CT HEAD WITHOUT CONTRAST
TECHNIQUE: Contiguous axial images were obtained from the base of
the skull through the vertex without contrast.

[Series 2: head_seq 4.5 h37s st · axial · 0.43mm/px · z∈[+993,+1119]mm · 16 of 32 slices shown, 20 images]
[im 2/32  brain]
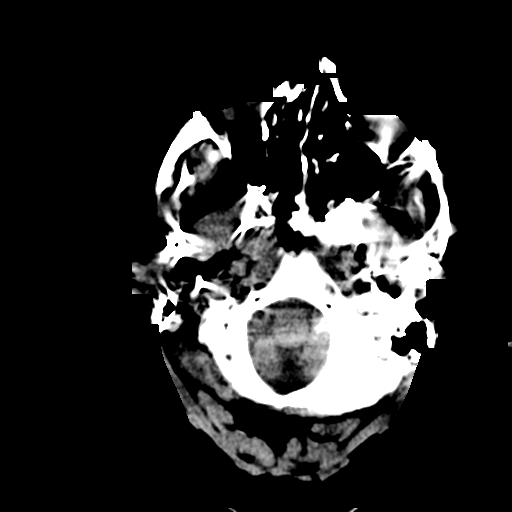
[im 2/32  bone]
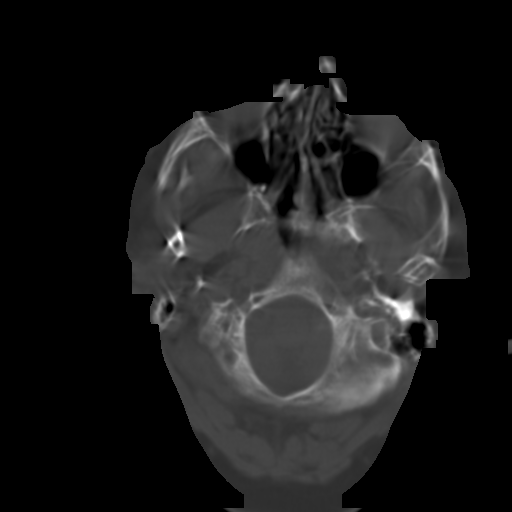
[im 4/32  brain]
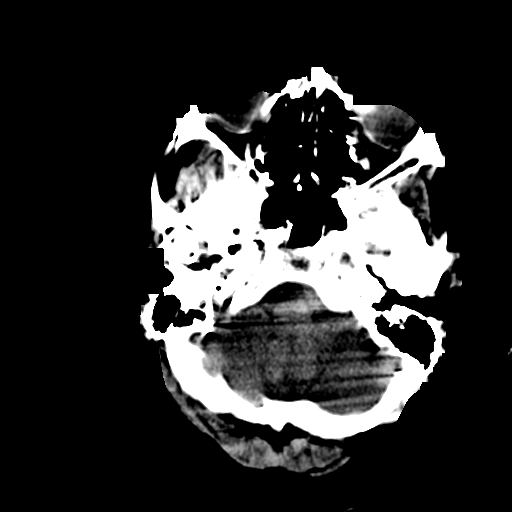
[im 5/32  brain]
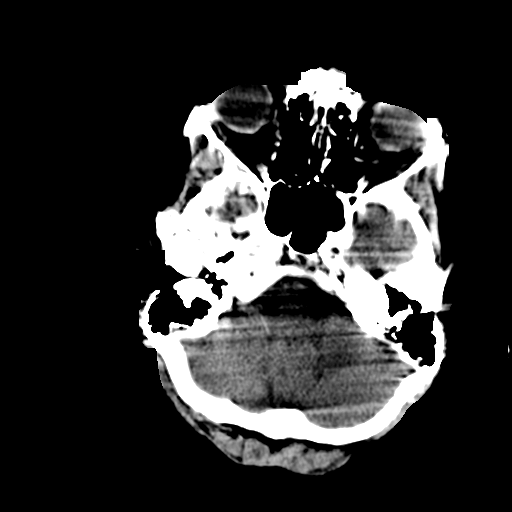
[im 7/32  brain]
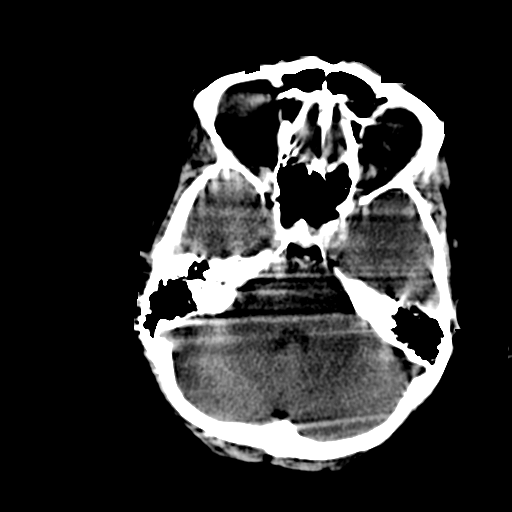
[im 10/32  brain]
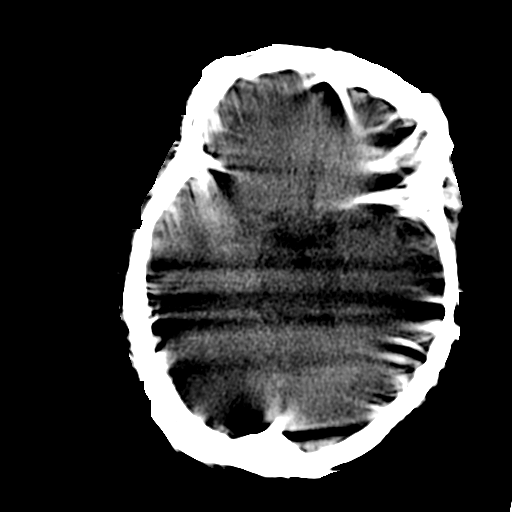
[im 10/32  bone]
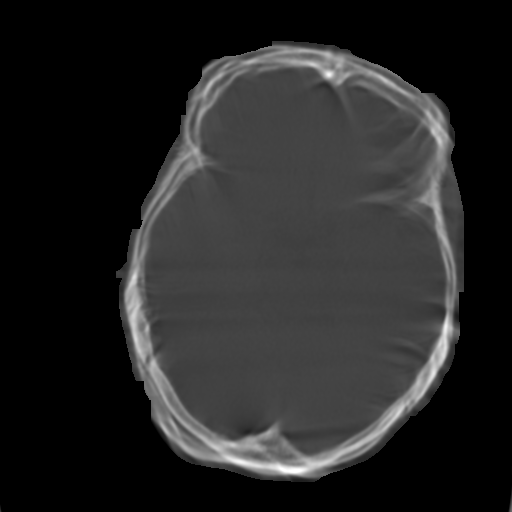
[im 12/32  brain]
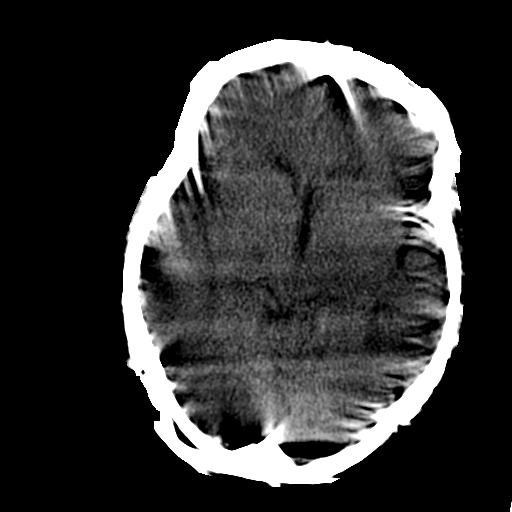
[im 14/32  brain]
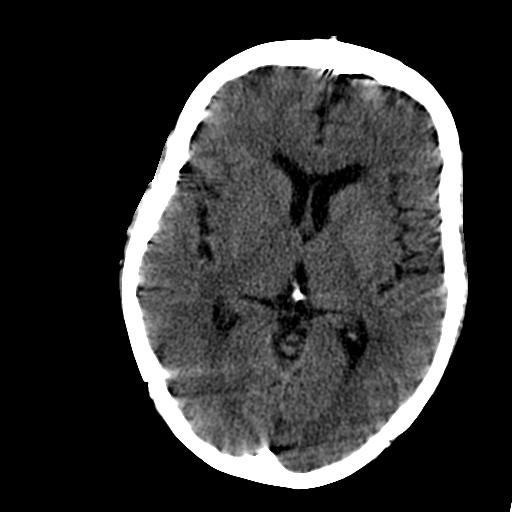
[im 15/32  brain]
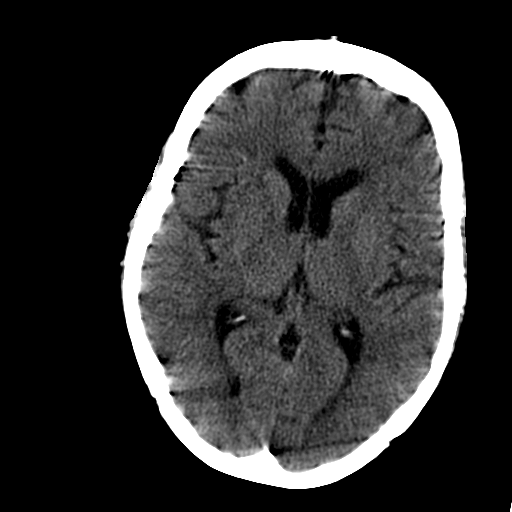
[im 17/32  brain]
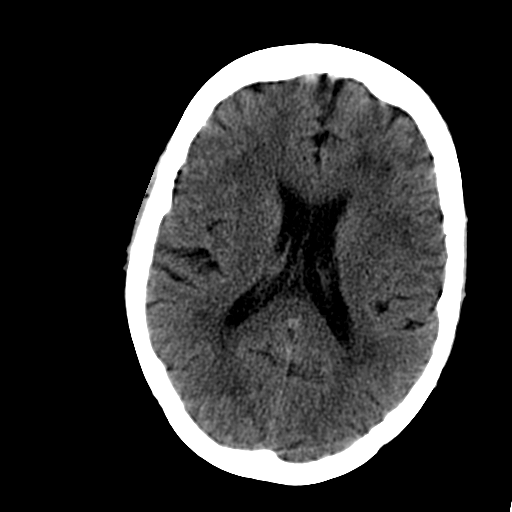
[im 17/32  bone]
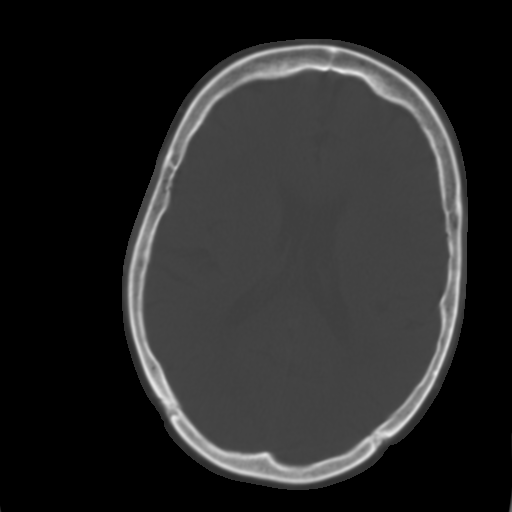
[im 18/32  brain]
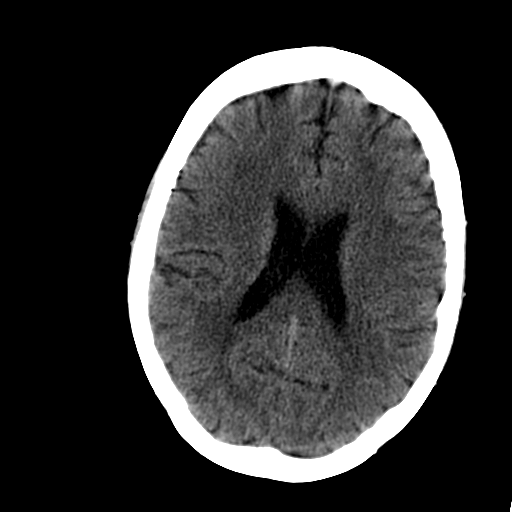
[im 20/32  brain]
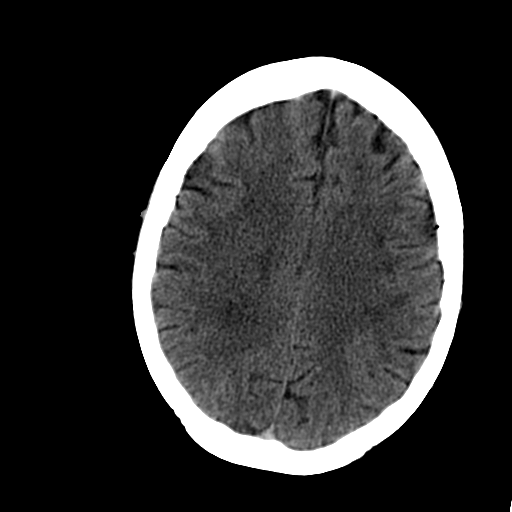
[im 22/32  brain]
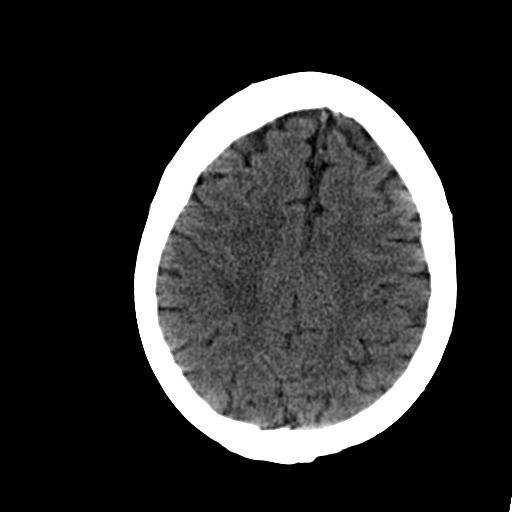
[im 25/32  brain]
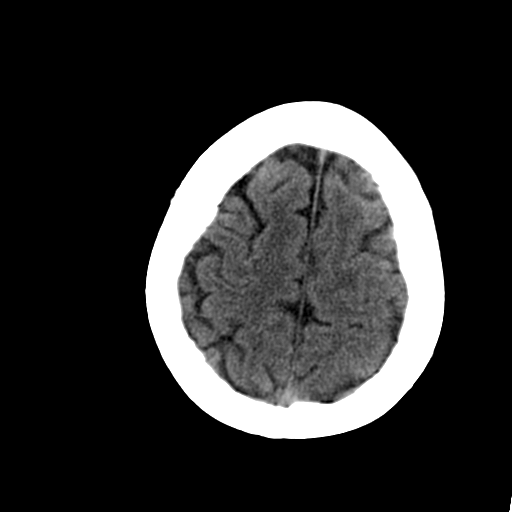
[im 25/32  bone]
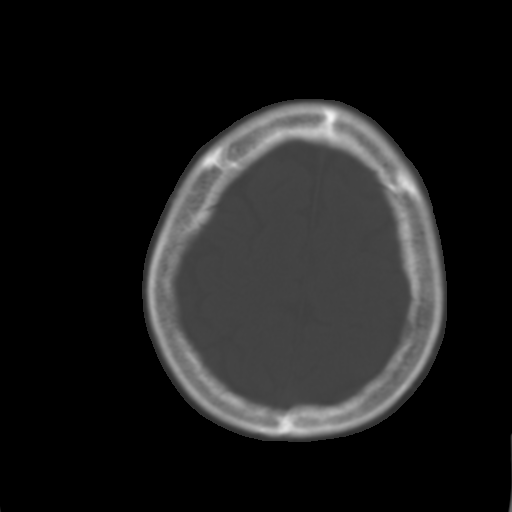
[im 27/32  brain]
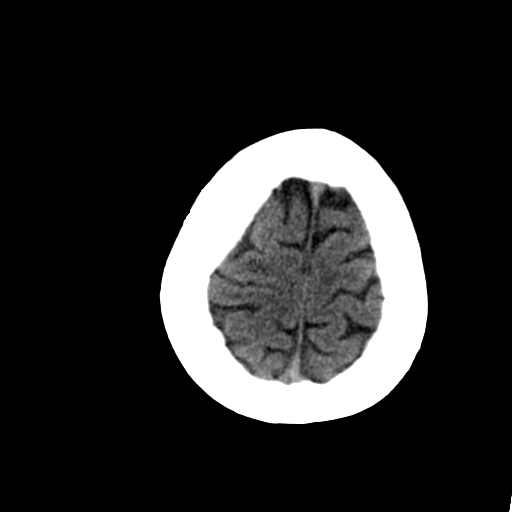
[im 28/32  brain]
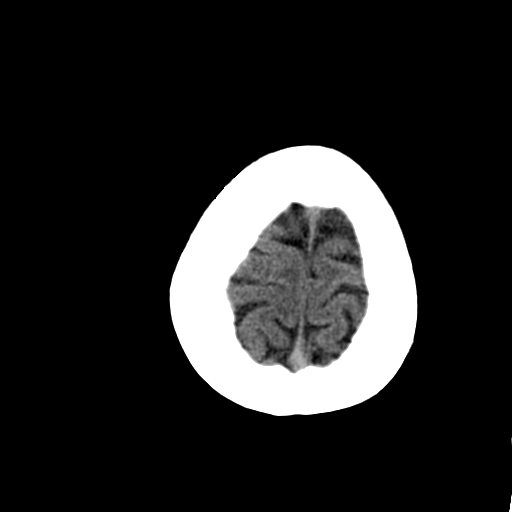
[im 30/32  brain]
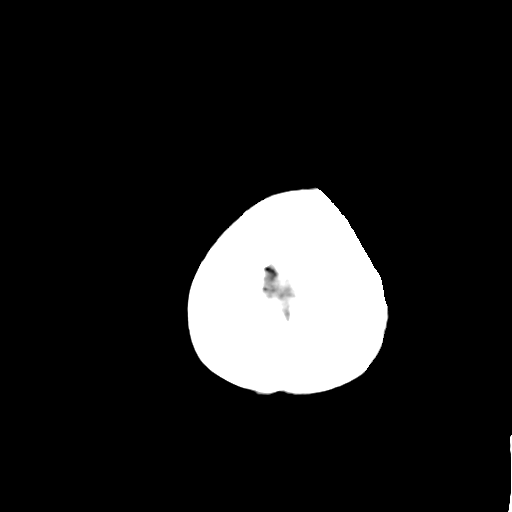

[16 of 30 positions shown; findings below may reference images not displayed]

FINDINGS: The study is limited due to patient motion, despite
repeating the study.  Old lacunar infarct in the left basal
ganglia.  No definite acute intracranial abnormality. No evidence
of hemorrhage hydrocephalus, or acute infarction.  No mass lesion.
No definite acute bony abnormality.
IMPRESSION: Limited study due to patient motion.  No definite acute
intracranial abnormality.

## 2010-12-23 IMAGING — CR DG CHEST 1V PORT
1 series · 1 of 1 positions shown · non-contrast
Comparison: 05/03/2009 and 08/25/2008.

CLINICAL DATA: Altered mental status.  Fever.

PORTABLE CHEST - 1 VIEW

[view not recorded]
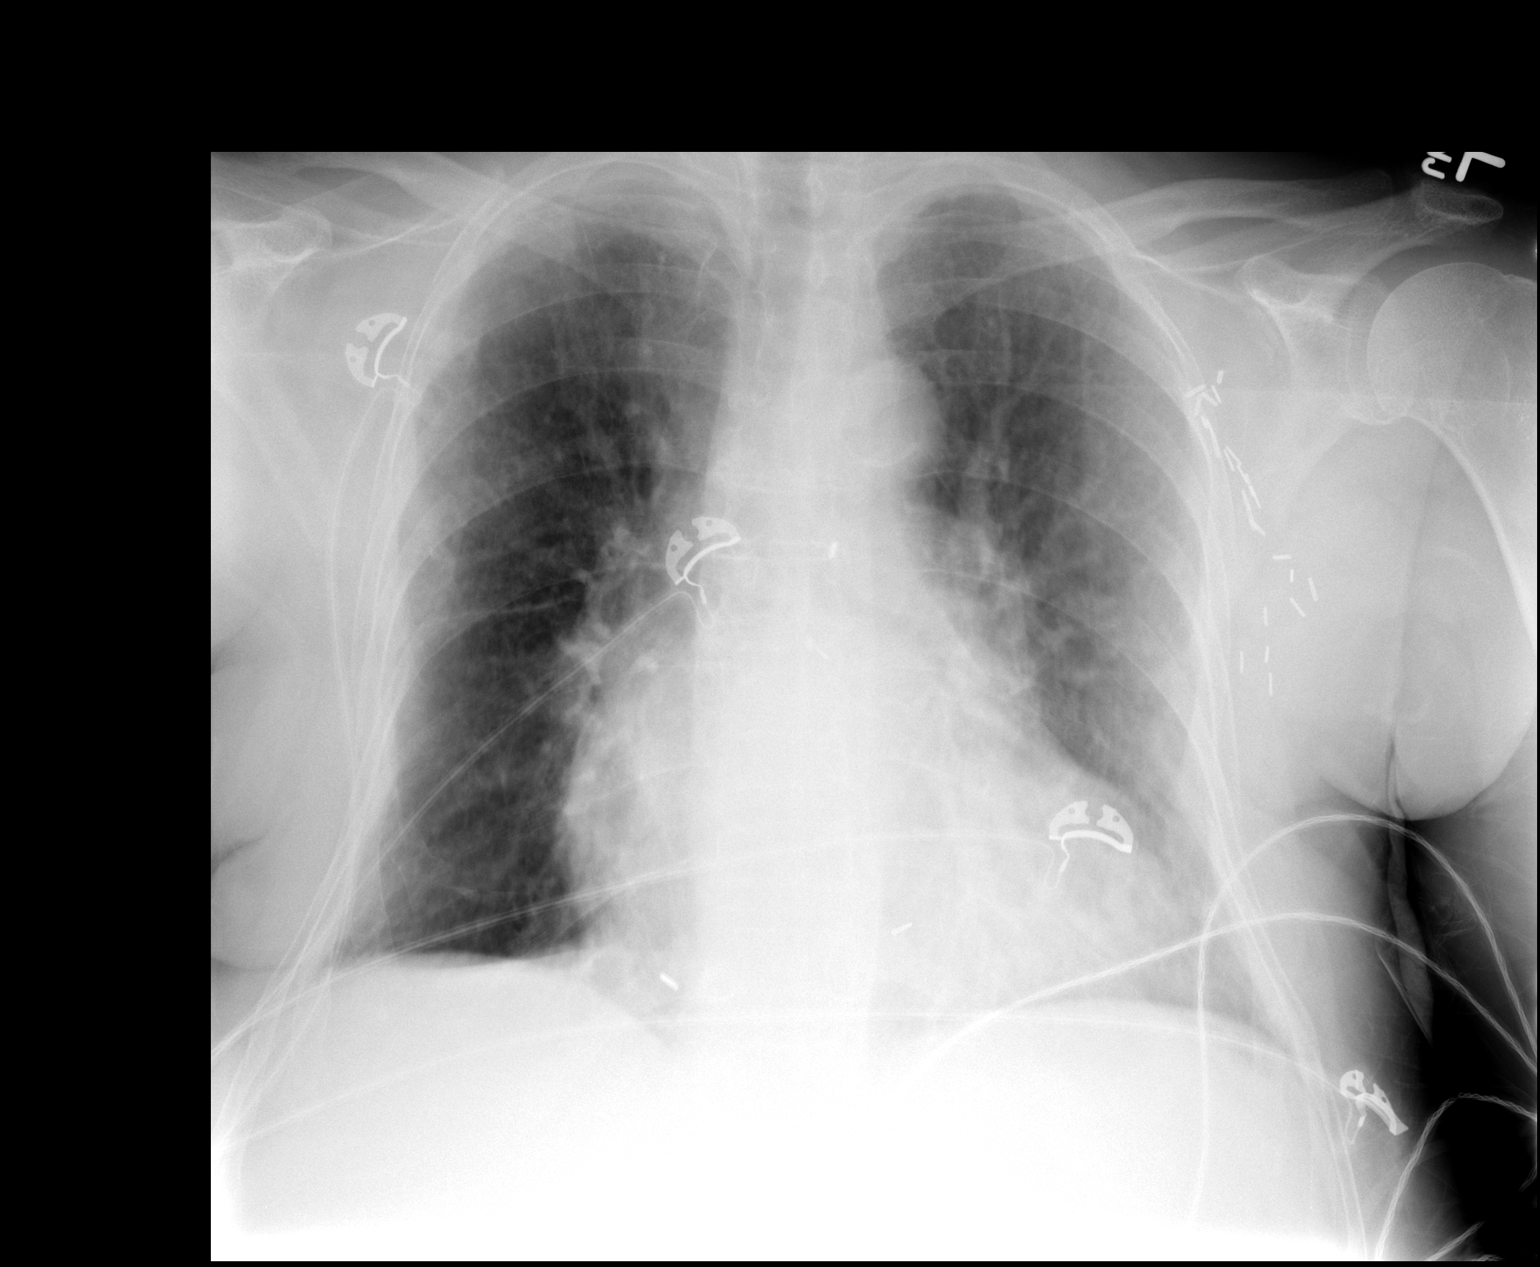

[1 of 1 positions shown; findings below may reference images not displayed]

FINDINGS: There is stable cardiac enlargement and chronic vascular
congestion.  No overt pulmonary edema or confluent airspace opacity
is identified.  Biapical scarring is similar to prior examinations.
There are multiple surgical clips in the left axilla.
IMPRESSION: Stable cardiomegaly and chronic vascular congestion.  No acute
chest findings identified.

## 2010-12-24 IMAGING — CR DG CHEST 1V PORT
1 series · 1 of 1 positions shown · non-contrast
Comparison: 05/04/2009

CLINICAL DATA: Fever

PORTABLE CHEST - 1 VIEW

[view not recorded]
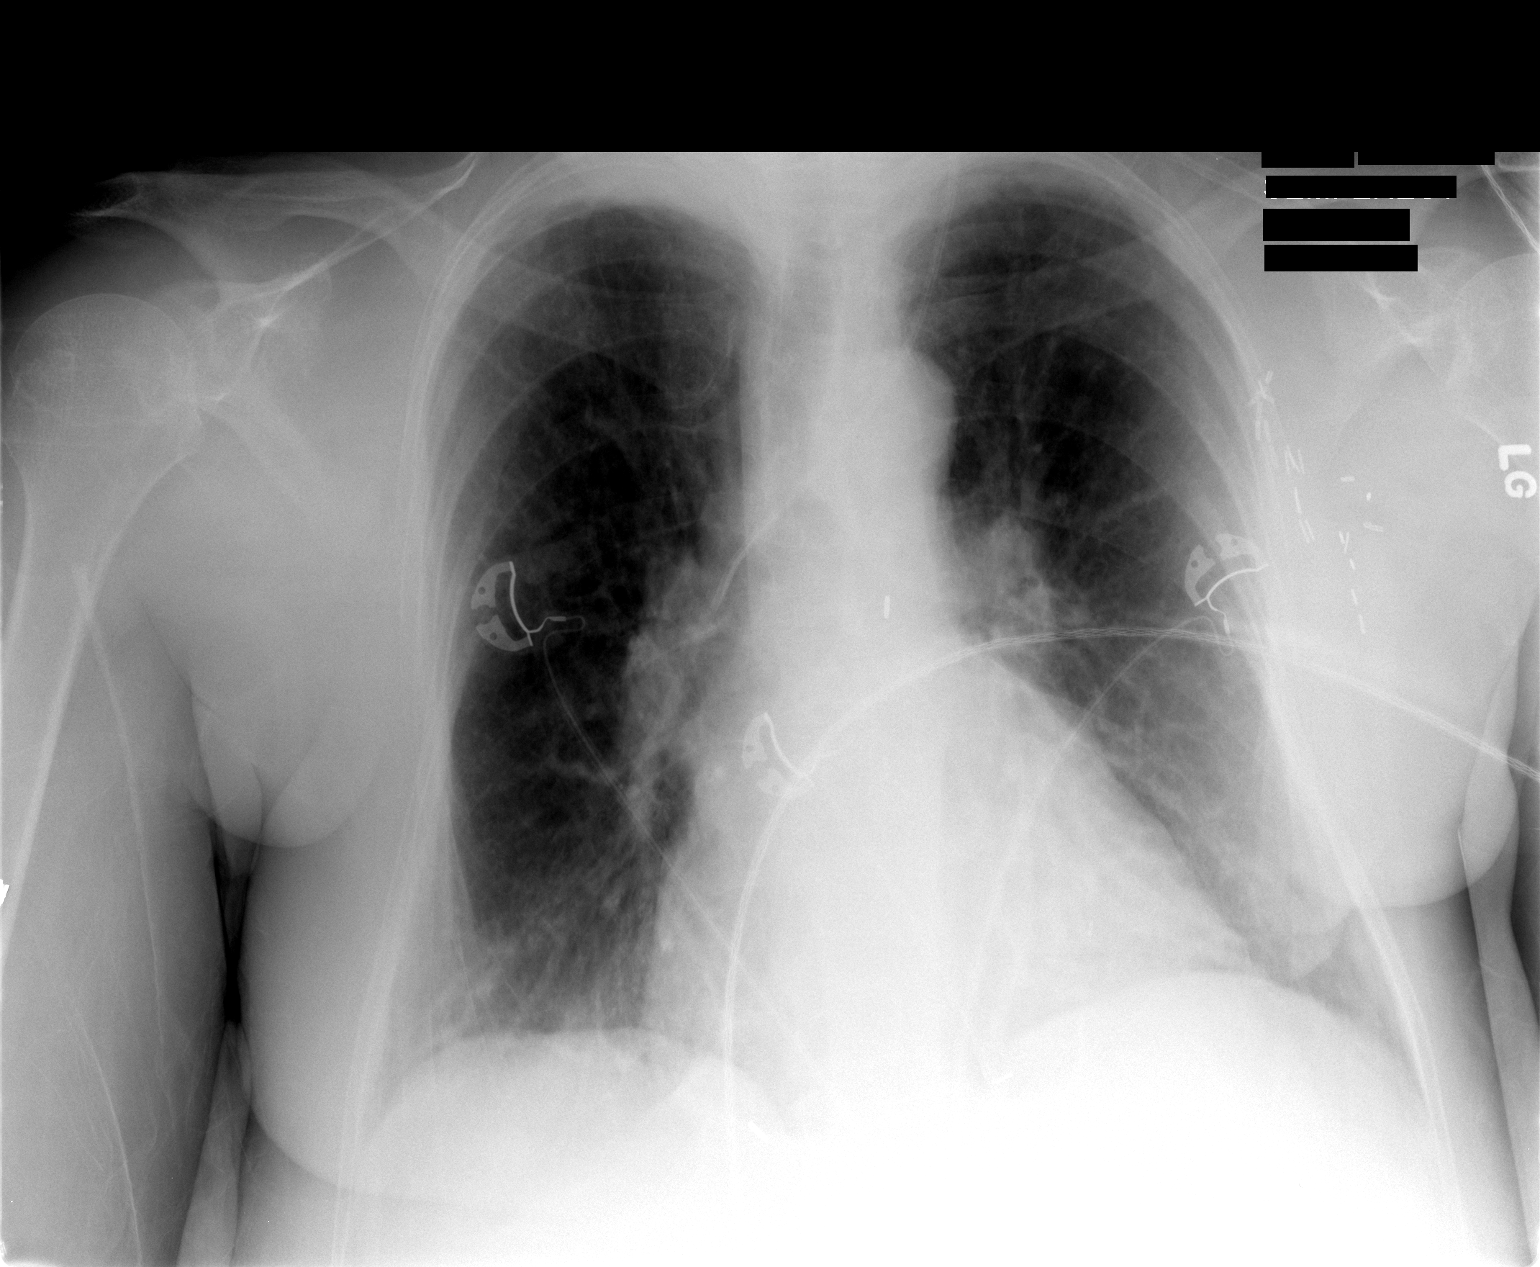

[1 of 1 positions shown; findings below may reference images not displayed]

FINDINGS: No active infiltrate.  Mild cardiomegaly.  Probable
pulmonary venous hypertension.  COPD.  Left jugular central venous
catheter is in the mid SVC.
IMPRESSION: COPD.  Mild cardiomegaly and pulmonary venous hypertension.
Negative for pneumonia.

## 2010-12-25 IMAGING — CR DG CHEST 1V PORT
1 series · 1 of 1 positions shown · non-contrast
Comparison: 05/05/2009

CLINICAL DATA: Altered mental status.  Fever.

PORTABLE CHEST - 1 VIEW

[view not recorded]
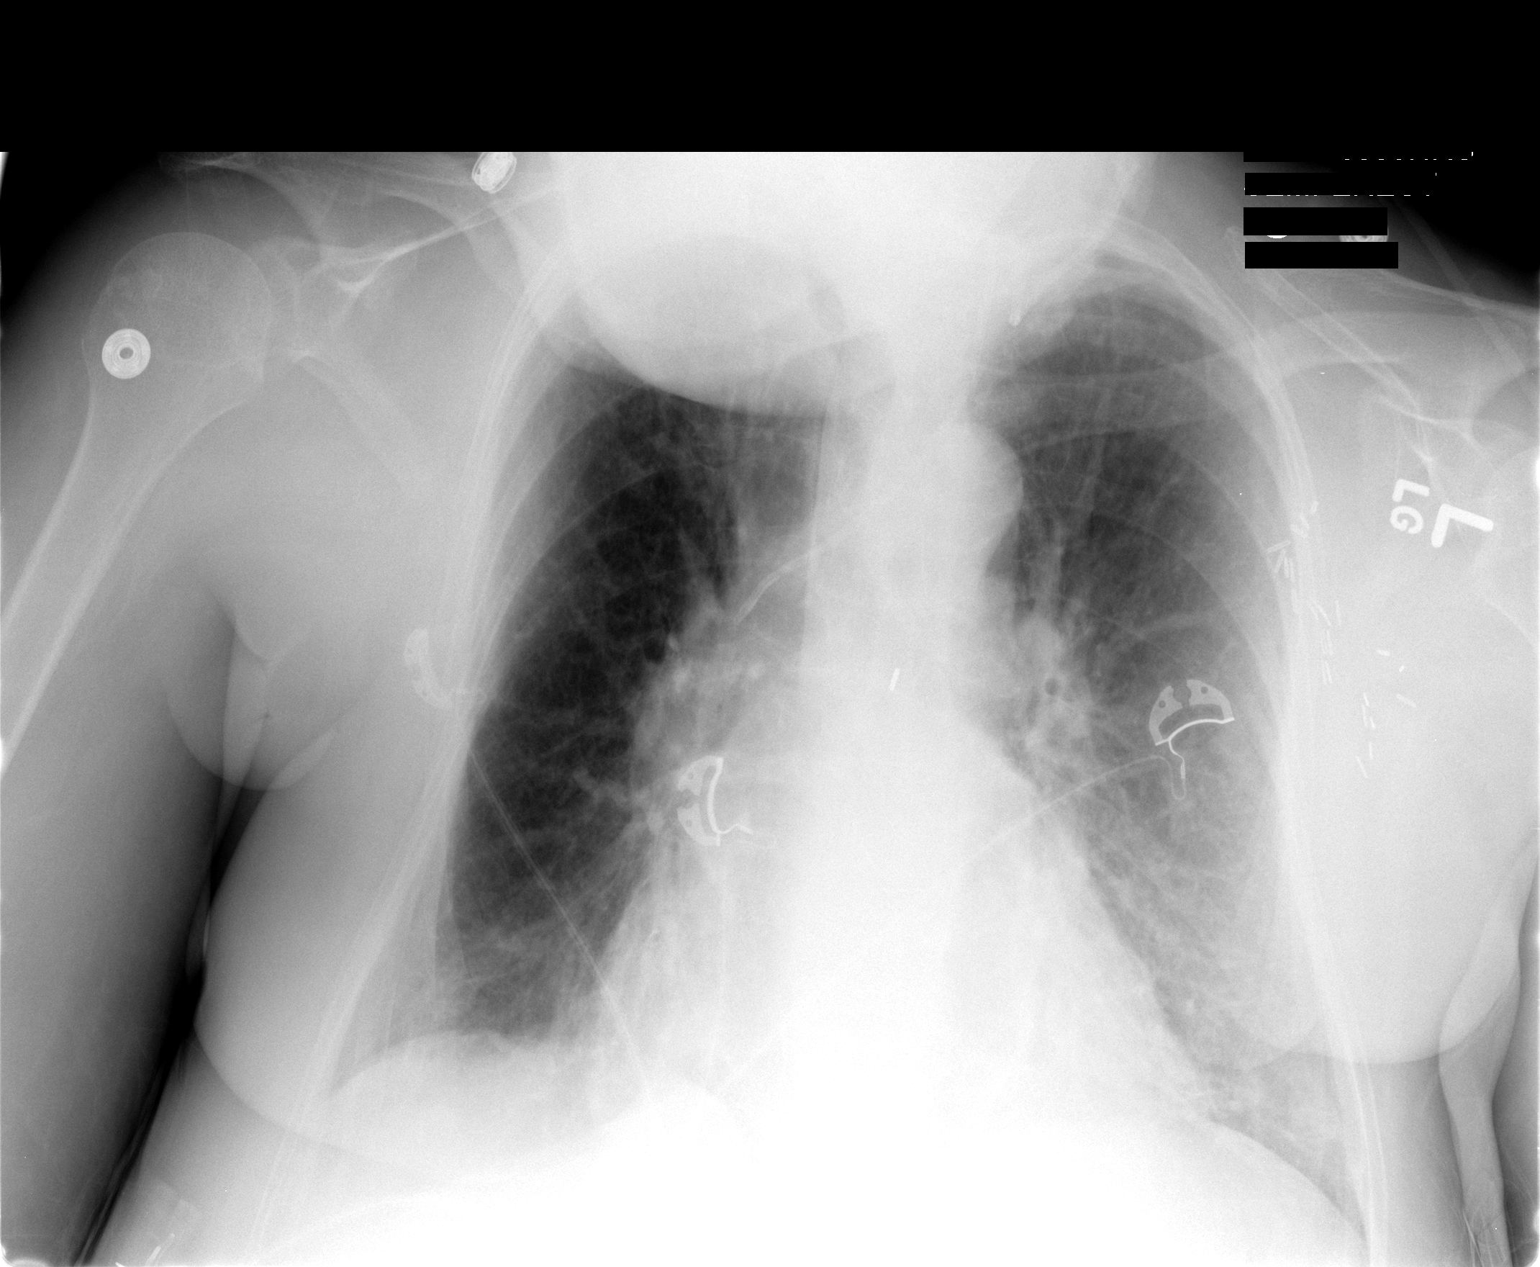

[1 of 1 positions shown; findings below may reference images not displayed]

FINDINGS: Central venous catheter is in the mid SVC.  Mild
cardiomegaly.  No CHF.  No infiltrate.  COPD.
IMPRESSION: COPD.  Negative for pneumonia.

## 2011-01-31 LAB — DIFFERENTIAL
Basophils Absolute: 0
Basophils Relative: 0
Eosinophils Absolute: 0.1
Monocytes Relative: 7
Neutrophils Relative %: 69

## 2011-01-31 LAB — URINALYSIS, ROUTINE W REFLEX MICROSCOPIC
Glucose, UA: NEGATIVE
Nitrite: NEGATIVE
Protein, ur: NEGATIVE
Specific Gravity, Urine: 1.005
Specific Gravity, Urine: 1.006
Urobilinogen, UA: 0.2
pH: 6

## 2011-01-31 LAB — CBC
HCT: 25.6 — ABNORMAL LOW
MCHC: 35.3
MCV: 86.8
Platelets: 261
Platelets: 200
Platelets: 221
RDW: 14.6
WBC: 10
WBC: 9.9

## 2011-01-31 LAB — URINE MICROSCOPIC-ADD ON

## 2011-01-31 LAB — BASIC METABOLIC PANEL
BUN: 25 — ABNORMAL HIGH
BUN: 10
BUN: 13
CO2: 26
Calcium: 9.1
Calcium: 8.7
Chloride: 95 — ABNORMAL LOW
Creatinine, Ser: 1.54 — ABNORMAL HIGH
Creatinine, Ser: 0.85
Creatinine, Ser: 0.86
Creatinine, Ser: 0.94
GFR calc Af Amer: >60
GFR calc non Af Amer: >60
GFR calc non Af Amer: >60
GFR calc non Af Amer: >60
Glucose, Bld: 91
Potassium: 4.6
Sodium: 129 — ABNORMAL LOW

## 2011-01-31 LAB — TYPE AND SCREEN
Donor AG Type: NEGATIVE
PT AG Type: NEGATIVE

## 2011-01-31 LAB — PROTIME-INR
INR: 1
Prothrombin Time: 13.3

## 2011-01-31 LAB — HEMOGLOBIN AND HEMATOCRIT, BLOOD: Hemoglobin: 11 — ABNORMAL LOW

## 2011-02-07 LAB — TYPE AND SCREEN
ABO/RH(D): B POS
Donor AG Type: NEGATIVE
Donor AG Type: NEGATIVE

## 2011-02-07 LAB — BASIC METABOLIC PANEL
Calcium: 9.2 mg/dL (ref 8.4–10.5)
Creatinine, Ser: 1.1 mg/dL (ref 0.4–1.2)
GFR calc Af Amer: >60 mL/min (ref >60)
GFR calc non Af Amer: 50 mL/min — ABNORMAL LOW (ref >60)
Glucose, Bld: 97 mg/dL (ref 70–99)
Sodium: 135 mEq/L (ref 135–145)

## 2011-02-07 LAB — CBC
Hemoglobin: 11 g/dL — ABNORMAL LOW (ref 12.0–15.0)
MCHC: 33.3 g/dL (ref 30.0–36.0)
Platelets: 272 10*3/uL (ref 150–400)
RDW: 14.4 % (ref 11.5–15.5)

## 2011-02-07 LAB — DIFFERENTIAL
Basophils Absolute: 0 10*3/uL (ref 0.0–0.1)
Basophils Relative: 1 % (ref 0–1)
Lymphocytes Relative: 29 % (ref 12–46)
Monocytes Absolute: 0.5 10*3/uL (ref 0.1–1.0)
Neutro Abs: 4.1 10*3/uL (ref 1.7–7.7)
Neutrophils Relative %: 61 % (ref 43–77)

## 2011-02-07 LAB — URINALYSIS, ROUTINE W REFLEX MICROSCOPIC
Bilirubin Urine: NEGATIVE
Nitrite: NEGATIVE
Protein, ur: NEGATIVE mg/dL
Specific Gravity, Urine: 1.014 (ref 1.005–1.030)
Urobilinogen, UA: 0.2 mg/dL (ref 0.0–1.0)

## 2011-02-07 LAB — URINE MICROSCOPIC-ADD ON

## 2011-02-07 LAB — PREPARE RBC (CROSSMATCH)

## 2011-02-07 LAB — APTT: aPTT: 30 seconds (ref 24–37)

## 2011-02-07 LAB — PROTIME-INR
INR: 1 (ref 0.00–1.49)
Prothrombin Time: 12.9 seconds (ref 11.6–15.2)

## 2011-02-10 LAB — BASIC METABOLIC PANEL
BUN: 10 mg/dL (ref 6–23)
BUN: 10 mg/dL (ref 6–23)
BUN: 8 mg/dL (ref 6–23)
BUN: 8 mg/dL (ref 6–23)
BUN: 9 mg/dL (ref 6–23)
CO2: 28 mEq/L (ref 19–32)
CO2: 29 mEq/L (ref 19–32)
Calcium: 8.1 mg/dL — ABNORMAL LOW (ref 8.4–10.5)
Calcium: 8.1 mg/dL — ABNORMAL LOW (ref 8.4–10.5)
Chloride: 90 mEq/L — ABNORMAL LOW (ref 96–112)
Chloride: 91 mEq/L — ABNORMAL LOW (ref 96–112)
Chloride: 96 mEq/L (ref 96–112)
Chloride: 96 mEq/L (ref 96–112)
Creatinine, Ser: 0.87 mg/dL (ref 0.4–1.2)
Creatinine, Ser: 1.01 mg/dL (ref 0.4–1.2)
GFR calc Af Amer: >60 mL/min (ref >60)
GFR calc Af Amer: >60 mL/min (ref >60)
GFR calc non Af Amer: >60 mL/min (ref >60)
GFR calc non Af Amer: >60 mL/min (ref >60)
GFR calc non Af Amer: >60 mL/min (ref >60)
Glucose, Bld: 105 mg/dL — ABNORMAL HIGH (ref 70–99)
Glucose, Bld: 129 mg/dL — ABNORMAL HIGH (ref 70–99)
Glucose, Bld: 150 mg/dL — ABNORMAL HIGH (ref 70–99)
Potassium: 3.3 mEq/L — ABNORMAL LOW (ref 3.5–5.1)
Potassium: 3.8 mEq/L (ref 3.5–5.1)
Potassium: 3.9 mEq/L (ref 3.5–5.1)
Potassium: 4.2 mEq/L (ref 3.5–5.1)
Sodium: 128 mEq/L — ABNORMAL LOW (ref 135–145)
Sodium: 129 mEq/L — ABNORMAL LOW (ref 135–145)

## 2011-02-10 LAB — POCT I-STAT 7, (LYTES, BLD GAS, ICA,H+H)
Hemoglobin: 9.9 g/dL — ABNORMAL LOW (ref 12.0–15.0)
Patient temperature: 37
Potassium: 3.7 mEq/L (ref 3.5–5.1)
TCO2: 30 mmol/L (ref 0–100)
pCO2 arterial: 40.3 mmHg (ref 35.0–45.0)
pH, Arterial: 7.46 — ABNORMAL HIGH (ref 7.350–7.400)
pO2, Arterial: 381 mmHg — ABNORMAL HIGH (ref 80.0–100.0)

## 2011-02-10 LAB — CBC
HCT: 25.7 % — ABNORMAL LOW (ref 36.0–46.0)
HCT: 30.2 % — ABNORMAL LOW (ref 36.0–46.0)
HCT: 35.3 % — ABNORMAL LOW (ref 36.0–46.0)
Hemoglobin: 11.6 g/dL — ABNORMAL LOW (ref 12.0–15.0)
Hemoglobin: 11.8 g/dL — ABNORMAL LOW (ref 12.0–15.0)
MCHC: 33.7 g/dL (ref 30.0–36.0)
MCHC: 33.8 g/dL (ref 30.0–36.0)
MCV: 87.7 fL (ref 78.0–100.0)
MCV: 87.8 fL (ref 78.0–100.0)
MCV: 88.4 fL (ref 78.0–100.0)
Platelets: 204 10*3/uL (ref 150–400)
Platelets: 240 10*3/uL (ref 150–400)
Platelets: 263 10*3/uL (ref 150–400)
RBC: 2.94 MIL/uL — ABNORMAL LOW (ref 3.87–5.11)
RBC: 3.44 MIL/uL — ABNORMAL LOW (ref 3.87–5.11)
RDW: 13.5 % (ref 11.5–15.5)
RDW: 14.4 % (ref 11.5–15.5)
WBC: 7.5 10*3/uL (ref 4.0–10.5)
WBC: 8.4 10*3/uL (ref 4.0–10.5)
WBC: 9.2 10*3/uL (ref 4.0–10.5)

## 2011-02-10 LAB — TYPE AND SCREEN
ABO/RH(D): B POS
Antibody Screen: POSITIVE
Donor AG Type: NEGATIVE

## 2011-02-10 LAB — HEMOGLOBIN AND HEMATOCRIT, BLOOD: Hemoglobin: 10.8 g/dL — ABNORMAL LOW (ref 12.0–15.0)

## 2012-12-25 ENCOUNTER — Encounter (HOSPITAL_COMMUNITY): Payer: Self-pay | Admitting: *Deleted

## 2012-12-25 ENCOUNTER — Emergency Department (HOSPITAL_COMMUNITY): Payer: Medicare Other

## 2012-12-25 ENCOUNTER — Emergency Department (HOSPITAL_COMMUNITY)
Admission: EM | Admit: 2012-12-25 | Discharge: 2012-12-25 | Disposition: A | Discharge status: Home / Self Care | Payer: Medicare Other | Attending: Emergency Medicine | Admitting: Emergency Medicine

## 2012-12-25 ENCOUNTER — Ambulatory Visit (HOSPITAL_COMMUNITY): Payer: Medicare Other

## 2012-12-25 DIAGNOSIS — S0993XA Unspecified injury of face, initial encounter: Secondary | ICD-10-CM | POA: Insufficient documentation

## 2012-12-25 DIAGNOSIS — S0990XA Unspecified injury of head, initial encounter: Secondary | ICD-10-CM | POA: Insufficient documentation

## 2012-12-25 DIAGNOSIS — Z951 Presence of aortocoronary bypass graft: Secondary | ICD-10-CM | POA: Insufficient documentation

## 2012-12-25 DIAGNOSIS — S7000XA Contusion of unspecified hip, initial encounter: Secondary | ICD-10-CM | POA: Insufficient documentation

## 2012-12-25 DIAGNOSIS — W19XXXA Unspecified fall, initial encounter: Secondary | ICD-10-CM

## 2012-12-25 DIAGNOSIS — S7001XA Contusion of right hip, initial encounter: Secondary | ICD-10-CM

## 2012-12-25 DIAGNOSIS — Y9389 Activity, other specified: Secondary | ICD-10-CM | POA: Insufficient documentation

## 2012-12-25 DIAGNOSIS — Z90710 Acquired absence of both cervix and uterus: Secondary | ICD-10-CM | POA: Insufficient documentation

## 2012-12-25 DIAGNOSIS — Z79899 Other long term (current) drug therapy: Secondary | ICD-10-CM | POA: Insufficient documentation

## 2012-12-25 DIAGNOSIS — Z853 Personal history of malignant neoplasm of breast: Secondary | ICD-10-CM | POA: Insufficient documentation

## 2012-12-25 DIAGNOSIS — I10 Essential (primary) hypertension: Secondary | ICD-10-CM | POA: Insufficient documentation

## 2012-12-25 DIAGNOSIS — I2510 Atherosclerotic heart disease of native coronary artery without angina pectoris: Secondary | ICD-10-CM | POA: Insufficient documentation

## 2012-12-25 DIAGNOSIS — W1809XA Striking against other object with subsequent fall, initial encounter: Secondary | ICD-10-CM | POA: Insufficient documentation

## 2012-12-25 DIAGNOSIS — E7800 Pure hypercholesterolemia, unspecified: Secondary | ICD-10-CM | POA: Insufficient documentation

## 2012-12-25 DIAGNOSIS — W050XXA Fall from non-moving wheelchair, initial encounter: Secondary | ICD-10-CM | POA: Insufficient documentation

## 2012-12-25 DIAGNOSIS — I252 Old myocardial infarction: Secondary | ICD-10-CM | POA: Insufficient documentation

## 2012-12-25 DIAGNOSIS — M542 Cervicalgia: Secondary | ICD-10-CM

## 2012-12-25 DIAGNOSIS — Y9229 Other specified public building as the place of occurrence of the external cause: Secondary | ICD-10-CM | POA: Insufficient documentation

## 2012-12-25 DIAGNOSIS — Z9089 Acquired absence of other organs: Secondary | ICD-10-CM | POA: Insufficient documentation

## 2012-12-25 HISTORY — DX: Malignant (primary) neoplasm, unspecified: C80.1

## 2012-12-25 HISTORY — DX: Pure hypercholesterolemia, unspecified: E78.00

## 2012-12-25 HISTORY — DX: Atherosclerotic heart disease of native coronary artery without angina pectoris: I25.10

## 2012-12-25 HISTORY — DX: Essential (primary) hypertension: I10

## 2012-12-25 HISTORY — DX: Acute myocardial infarction, unspecified: I21.9

## 2012-12-25 LAB — CBC WITH DIFFERENTIAL/PLATELET
Eosinophils Absolute: 0.1 10*3/uL (ref 0.0–0.7)
Eosinophils Relative: 1 % (ref 0–5)
Hemoglobin: 14.4 g/dL (ref 12.0–15.0)
Lymphocytes Relative: 28 % (ref 12–46)
Lymphs Abs: 2 10*3/uL (ref 0.7–4.0)
Monocytes Absolute: 0.5 10*3/uL (ref 0.1–1.0)
Monocytes Relative: 6 % (ref 3–12)
Platelets: 208 10*3/uL (ref 150–400)
WBC: 7.1 10*3/uL (ref 4.0–10.5)

## 2012-12-25 LAB — TYPE AND SCREEN
ABO/RH(D): B POS
Antibody Screen: POSITIVE
DAT, IgG: NEGATIVE

## 2012-12-25 LAB — BASIC METABOLIC PANEL
BUN: 10 mg/dL (ref 6–23)
CO2: 31 mEq/L (ref 19–32)
Chloride: 100 mEq/L (ref 96–112)
Creatinine, Ser: 0.86 mg/dL (ref 0.50–1.10)
Glucose, Bld: 93 mg/dL (ref 70–99)
Potassium: 3.5 mEq/L (ref 3.5–5.1)

## 2012-12-25 MED ORDER — MORPHINE SULFATE 4 MG/ML IJ SOLN: 6.0000 mg | Freq: Once | INTRAMUSCULAR | Status: DC

## 2012-12-25 NOTE — ED Notes (Signed)
Per ems pt was in wheelchair, drove wheelchair off curb, fell out of wheelchair, struck head on pavement, no LOC, right hip pain. No obvious visible/palpable deformity. Severe pain. No other signs of trauma.

## 2012-12-25 NOTE — ED Notes (Signed)
Charge unsuccessful x2 IV

## 2012-12-25 NOTE — ED Provider Notes (Signed)
CSN: [id number]     Arrival date & time 12/25/12  1337 History     First MD Initiated Contact with Patient 12/25/12 1359     Chief Complaint  Patient presents with  . Fall  . Hip Pain    HPI Patient was riding in her motorized wheelchair when she drove her wheel chair off a curb and injured her right hip and struck the right side of her head.  She reports no loss of consciousness.  She denies use of anticoagulants.  She reports moderate pain in her right hip that is worse with range of motion.  She has had bilateral hip replacements before in the past.  She reports mild right-sided headache at this time.  Mild neck discomfort.  She's immobilized in cervical collar.  She denies back pain.  No chest pain shortness breath.  No abdominal pain.  No nausea vomiting or diarrhea.   Past Medical History  Diagnosis Date  . Cancer     breast  . CAD (coronary artery disease)   . Hypertension   . High cholesterol   . MI (myocardial infarction)     w CABG   Past Surgical History  Procedure Laterality Date  . Mastectomy      double  . Abdominal hysterectomy    . Coronary artery bypass graft     No family history on file. History  Substance Use Topics  . Smoking status: Not on file  . Smokeless tobacco: Not on file  . Alcohol Use: Not on file   OB History   Grav Para Term Preterm Abortions TAB SAB Ect Mult Living                 Review of Systems  All other systems reviewed and are negative.    Allergies  Review of patient's allergies indicates no known allergies.  Home Medications   Current Outpatient Rx  Name  Route  Sig  Dispense  Refill  . atenolol (TENORMIN) 25 MG tablet   Oral   Take 25 mg by mouth 2 (two) times daily.         . chlordiazePOXIDE (LIBRIUM) 5 MG capsule   Oral   Take 5 mg by mouth 2 (two) times daily.         Marland Kitchen dicyclomine (BENTYL) 20 MG tablet   Oral   Take 20 mg by mouth every 4 (four) hours as needed (irritable bowl syndrome).          . DULoxetine (CYMBALTA) 60 MG capsule   Oral   Take 60 mg by mouth daily.         . fesoterodine (TOVIAZ) 4 MG TB24 tablet   Oral   Take 4 mg by mouth daily.         . furosemide (LASIX) 20 MG tablet   Oral   Take 20 mg by mouth.         . gabapentin (NEURONTIN) 300 MG capsule   Oral   Take 300 mg by mouth 3 (three) times daily.         Marland Kitchen HYDROcodone-acetaminophen (NORCO) 10-325 MG per tablet   Oral   Take 1 tablet by mouth 2 (two) times daily as needed for pain.         . isosorbide mononitrate (IMDUR) 30 MG 24 hr tablet   Oral   Take 30 mg by mouth daily.         Marland Kitchen lisinopril (PRINIVIL,ZESTRIL) 10 MG tablet  Oral   Take 10 mg by mouth daily.         . promethazine (PHENERGAN) 25 MG tablet   Oral   Take 25 mg by mouth every 6 (six) hours as needed for nausea.         . rosuvastatin (CRESTOR) 20 MG tablet   Oral   Take 20 mg by mouth daily.         . vitamin B-12 (CYANOCOBALAMIN) 100 MCG tablet   Oral   Take 50 mcg by mouth daily.          BP 184/76  Pulse 58  Temp(Src) 98.4 F (36.9 C) (Oral)  Resp 20  SpO2 99% Physical Exam  Nursing note and vitals reviewed. Constitutional: She is oriented to person, place, and time. She appears well-developed and well-nourished. No distress.  HENT:  Head: Normocephalic and atraumatic.  Mild tenderness of the right parietal scalp without obvious hematoma.  No laceration.  Eyes: EOM are normal.  Neck: Neck supple.  Immobilized in cervical collar  Cardiovascular: Normal rate, regular rhythm and normal heart sounds.   Pulmonary/Chest: Effort normal and breath sounds normal.  Abdominal: Soft. She exhibits no distension. There is no tenderness.  Musculoskeletal: Normal range of motion.  Mild pain with range of motion of right hip.  No obvious deformity of the right hip.  No shortening or rotation of the right lower extremity.  Pelvis is stable.  Full range of motion bilateral upper extremity major  muscles and joints.  Full range of motion of left hip  Neurological: She is alert and oriented to person, place, and time.  Skin: Skin is warm and dry.  Psychiatric: She has a normal mood and affect. Judgment normal.    ED Course   Procedures (including critical care time)   Date: 12/25/2012  Rate: 58  Rhythm: normal sinus rhythm  QRS Axis: normal  Intervals: normal  ST/T Wave abnormalities: normal  Conduction Disutrbances: none  Narrative Interpretation:   Old EKG Reviewed: No significant changes noted     Labs Reviewed  PROTIME-INR  CBC WITH DIFFERENTIAL  BASIC METABOLIC PANEL  TYPE AND SCREEN   Dg Chest 1 View  12/25/2012   *RADIOLOGY REPORT*  Clinical Data: Recent traumatic injury  CHEST - 1 VIEW  Comparison: 09/11/2012  Findings: Stable cardiomegaly is again seen.  The lungs are well- aerated bilaterally.  Postsurgical changes are again noted.  No acute bony abnormality is seen.  IMPRESSION: No acute abnormality noted.   Original Report Authenticated By: Alcide Clever, M.D.   Dg Hip Complete Right  12/25/2012   *RADIOLOGY REPORT*  Clinical Data: Recent traumatic injury with pain  RIGHT HIP - COMPLETE 2+ VIEW  Comparison: 03/15/2011  Findings: Postsurgical changes are noted in the hips bilaterally. No dislocation is identified.  No acute fracture is seen.  IMPRESSION: No acute abnormality noted.   Original Report Authenticated By: Alcide Clever, M.D.   Ct Head Wo Contrast  12/25/2012   CLINICAL DATA:  Fall from wheelchair. Hip pain.  Breast cancer.  EXAM: CT HEAD WITHOUT CONTRAST  CT CERVICAL WITHOUT CONTRAST  TECHNIQUE: Contiguous axial images were obtained from the base of the skull through the vertex without contrast. Multidetector CT imaging of the cervical spine was performed using the standard protocol without intravenous contrast.  COMPARISON:  09/11/2012  FINDINGS: CT HEAD FINDINGS  Chronic lacunar infarcts along the lateral margins of the left lentiform nucleus.  Periventricular white matter and corona radiata hypodensities favor chronic  ischemic microvascular white matter disease. No intracranial hemorrhage, mass lesion, or acute CVA. No additional significant findings observed.  CT CERVICAL FINDINGS  Cervical spondylosis and degenerative disk disease noted with disk bulge at C3-4, and loss of disc height, endplate sclerosis, and posterior osseous ridging eccentric to the left at C6-7. No cervical spine fracture or malalignment identified. Biapical pleural parenchymal scarring is present, right greater than left.  IMPRESSION: CT HEAD IMPRESSION  1. Remote left-sided lacunar infarcts and chronic microvascular white matter disease, without acute intracranial findings  CT CERVICAL IMPRESSION  1. Cervical spondylosis and degenerative disc disease, without fracture, acute subluxation, or acute findings.   Electronically Signed   By: Herbie Baltimore   On: 12/25/2012 15:55   Ct Cervical Spine Wo Contrast  12/25/2012   CLINICAL DATA:  Fall from wheelchair. Hip pain.  Breast cancer.  EXAM: CT HEAD WITHOUT CONTRAST  CT CERVICAL WITHOUT CONTRAST  TECHNIQUE: Contiguous axial images were obtained from the base of the skull through the vertex without contrast. Multidetector CT imaging of the cervical spine was performed using the standard protocol without intravenous contrast.  COMPARISON:  09/11/2012  FINDINGS: CT HEAD FINDINGS  Chronic lacunar infarcts along the lateral margins of the left lentiform nucleus. Periventricular white matter and corona radiata hypodensities favor chronic ischemic microvascular white matter disease. No intracranial hemorrhage, mass lesion, or acute CVA. No additional significant findings observed.  CT CERVICAL FINDINGS  Cervical spondylosis and degenerative disk disease noted with disk bulge at C3-4, and loss of disc height, endplate sclerosis, and posterior osseous ridging eccentric to the left at C6-7. No cervical spine fracture or malalignment  identified. Biapical pleural parenchymal scarring is present, right greater than left.  IMPRESSION: CT HEAD IMPRESSION  1. Remote left-sided lacunar infarcts and chronic microvascular white matter disease, without acute intracranial findings  CT CERVICAL IMPRESSION  1. Cervical spondylosis and degenerative disc disease, without fracture, acute subluxation, or acute findings.   Electronically Signed   By: Herbie Baltimore   On: 12/25/2012 15:55   I personally reviewed the imaging tests through PACS system I reviewed available ER/hospitalization records through the EMR   1. Fall, initial encounter   2. Contusion of right hip, initial encounter   3. Headache   4. Neck pain     MDM  Patient feels much better at this time.  CT head C-spine is normal.  Plain films of right hip are normal.  Discharge home in good condition.  Instructed to followup with Dr. Richardson Dopp and her orthopedic surgeon or her primary care physician as needed.  The patient is hydrocodone at home which she can take for pain every 4 hours when necessary  Lyanne Co, MD 12/25/12 1616

## 2012-12-25 NOTE — Progress Notes (Signed)
During 12/25/12 [facility] ED visit pr confirmed pcp as david swayne EPIC updated

## 2012-12-25 NOTE — ED Notes (Signed)
She states she slipped and fell out of her wheelchair while shopping today.  She c/o pain bilat. Hips/worse on Right.  She has had orthopedic procedure(s) in the past by Dr. Charlann Boxer.

## 2012-12-25 NOTE — ED Notes (Signed)
We have been diligently attempting to obtain IV start, with no avail.  Pt. States she feels "better"; and lab is in her room as I write this drawing her blood tests.

## 2013-01-16 ENCOUNTER — Ambulatory Visit: Payer: Medicare Other | Attending: Family Medicine | Admitting: Physical Therapy

## 2013-01-16 DIAGNOSIS — IMO0001 Reserved for inherently not codable concepts without codable children: Secondary | ICD-10-CM | POA: Insufficient documentation

## 2013-01-16 DIAGNOSIS — R262 Difficulty in walking, not elsewhere classified: Secondary | ICD-10-CM | POA: Insufficient documentation

## 2013-02-09 ENCOUNTER — Encounter: Payer: Self-pay | Admitting: *Deleted

## 2013-02-09 ENCOUNTER — Encounter: Payer: Self-pay | Admitting: Interventional Cardiology

## 2013-02-09 DIAGNOSIS — E7800 Pure hypercholesterolemia, unspecified: Secondary | ICD-10-CM | POA: Insufficient documentation

## 2013-02-09 DIAGNOSIS — M549 Dorsalgia, unspecified: Secondary | ICD-10-CM | POA: Insufficient documentation

## 2013-02-09 DIAGNOSIS — M1990 Unspecified osteoarthritis, unspecified site: Secondary | ICD-10-CM | POA: Insufficient documentation

## 2013-02-09 DIAGNOSIS — K219 Gastro-esophageal reflux disease without esophagitis: Secondary | ICD-10-CM | POA: Insufficient documentation

## 2013-02-09 DIAGNOSIS — D649 Anemia, unspecified: Secondary | ICD-10-CM | POA: Insufficient documentation

## 2013-02-09 DIAGNOSIS — M2550 Pain in unspecified joint: Secondary | ICD-10-CM | POA: Insufficient documentation

## 2013-02-09 DIAGNOSIS — E785 Hyperlipidemia, unspecified: Secondary | ICD-10-CM | POA: Insufficient documentation

## 2013-02-09 DIAGNOSIS — F419 Anxiety disorder, unspecified: Secondary | ICD-10-CM | POA: Insufficient documentation

## 2013-02-09 DIAGNOSIS — K589 Irritable bowel syndrome without diarrhea: Secondary | ICD-10-CM | POA: Insufficient documentation

## 2013-02-09 DIAGNOSIS — R609 Edema, unspecified: Secondary | ICD-10-CM | POA: Insufficient documentation

## 2013-02-09 DIAGNOSIS — I10 Essential (primary) hypertension: Secondary | ICD-10-CM | POA: Insufficient documentation

## 2013-02-09 DIAGNOSIS — I2510 Atherosclerotic heart disease of native coronary artery without angina pectoris: Secondary | ICD-10-CM | POA: Insufficient documentation

## 2013-02-09 DIAGNOSIS — I219 Acute myocardial infarction, unspecified: Secondary | ICD-10-CM | POA: Insufficient documentation

## 2013-02-09 DIAGNOSIS — E039 Hypothyroidism, unspecified: Secondary | ICD-10-CM | POA: Insufficient documentation

## 2013-02-09 DIAGNOSIS — I509 Heart failure, unspecified: Secondary | ICD-10-CM | POA: Insufficient documentation

## 2013-02-09 DIAGNOSIS — C801 Malignant (primary) neoplasm, unspecified: Secondary | ICD-10-CM | POA: Insufficient documentation

## 2013-02-17 ENCOUNTER — Ambulatory Visit: Payer: Medicare Other | Admitting: Interventional Cardiology

## 2013-02-27 ENCOUNTER — Other Ambulatory Visit: Payer: Self-pay | Admitting: Interventional Cardiology

## 2013-03-04 ENCOUNTER — Ambulatory Visit: Payer: Medicare Other | Admitting: Interventional Cardiology

## 2013-03-12 ENCOUNTER — Ambulatory Visit: Payer: Medicare Other | Admitting: Interventional Cardiology

## 2013-03-17 ENCOUNTER — Ambulatory Visit: Payer: Medicare Other | Admitting: Interventional Cardiology

## 2013-03-25 ENCOUNTER — Encounter: Payer: Self-pay | Admitting: Interventional Cardiology

## 2013-04-25 ENCOUNTER — Ambulatory Visit (INDEPENDENT_AMBULATORY_CARE_PROVIDER_SITE_OTHER): Payer: Medicare Other | Admitting: Interventional Cardiology

## 2013-04-25 ENCOUNTER — Encounter: Payer: Self-pay | Admitting: Interventional Cardiology

## 2013-04-25 VITALS — BP 181/92 | HR 68 | Ht 65.0 in

## 2013-04-25 DIAGNOSIS — I2510 Atherosclerotic heart disease of native coronary artery without angina pectoris: Secondary | ICD-10-CM

## 2013-04-25 DIAGNOSIS — I219 Acute myocardial infarction, unspecified: Secondary | ICD-10-CM

## 2013-04-25 DIAGNOSIS — E785 Hyperlipidemia, unspecified: Secondary | ICD-10-CM

## 2013-04-25 DIAGNOSIS — I10 Essential (primary) hypertension: Secondary | ICD-10-CM

## 2013-04-25 NOTE — Progress Notes (Signed)
Patient ID: [name], female   DOB: [birth year], 67 y.o.   MRN: [id number]    [address] [place], [place]  [address] Phone: [phone] Fax:  [phone]  Date:  04/25/2013   ID:  [name] December 02, 1945, MRN [id number]  PCP:  Sissy Hoff, MD      History of Present Illness: [name] is a 67 y.o. female who has had CAD, HTN and tobacco abuse. SHe has had EECP for CAD that could not be revascularlized.  She has not had chest pain similar to what she had before her CABG.  BP has been better recently at home, but high in the MDs office.  SHe has not taken her BP meds today. Compliance has been an issue in the past. [facility] states that she takes her meds as prescribed.. She has home health that tries to help with her meds. BP at home asbeen as low as 100/60 recently.    Wt Readings from Last 3 Encounters:  No data found for Wt     Past Medical History  Diagnosis Date  . Cancer     breast  . CAD (coronary artery disease)   . Hypertension   . High cholesterol   . MI (myocardial infarction)     w CABG  . Hyperlipidemia   . Pain in joint   . Osteoarthritis   . Anemia   . Anxiety   . CHF (congestive heart failure)   . IBS (irritable bowel syndrome)   . Osteoarthritis   . Back pain   . Hypothyroidism   . Edema   . GERD (gastroesophageal reflux disease)     Current Outpatient Prescriptions  Medication Sig Dispense Refill  . atenolol (TENORMIN) 25 MG tablet Take 25 mg by mouth 2 (two) times daily.      . chlordiazePOXIDE (LIBRIUM) 5 MG capsule Take 5 mg by mouth 2 (two) times daily.      Marland Kitchen dicyclomine (BENTYL) 20 MG tablet Take 20 mg by mouth every 4 (four) hours as needed (irritable bowl syndrome).      . DULoxetine (CYMBALTA) 60 MG capsule Take 60 mg by mouth daily.      . fesoterodine (TOVIAZ) 4 MG TB24 tablet Take by mouth daily. TAKING 4 MG IN A.M AND 8 MG AT NIGHT      . furosemide (LASIX) 20 MG tablet Take 20 mg by mouth.      .  gabapentin (NEURONTIN) 300 MG capsule Take 300 mg by mouth 3 (three) times daily.      Marland Kitchen HYDROcodone-acetaminophen (NORCO) 10-325 MG per tablet Take 1 tablet by mouth 2 (two) times daily as needed for pain.      . isosorbide mononitrate (IMDUR) 30 MG 24 hr tablet Take 30 mg by mouth daily.      Marland Kitchen lisinopril (PRINIVIL,ZESTRIL) 10 MG tablet TAKE 1 TABLET BY MOUTH EVERY DAY  30 tablet  2  . promethazine (PHENERGAN) 25 MG tablet Take 25 mg by mouth every 6 (six) hours as needed for nausea.      . rosuvastatin (CRESTOR) 20 MG tablet Take 20 mg by mouth daily.      . vitamin B-12 (CYANOCOBALAMIN) 100 MCG tablet Take 50 mcg by mouth daily.       No current facility-administered medications for this visit.    Allergies:   No Known Allergies  Social History:  The patient  reports that she has been smoking.  She  does not have any smokeless tobacco history on file.   Family History:  The patient's family history includes CAD in her father; Heart attack in her brother.   ROS:  Please see the history of present illness.  No nausea, vomiting.  No fevers, chills.  No focal weakness.  No dysuria.    All other systems reviewed and negative.   PHYSICAL EXAM: VS:  BP 181/92  Pulse 68  Ht 5\' 5"  (1.651 m) Well nourished, well developed, in no acute distress HEENT: normal Neck: no JVD, no carotid bruits Cardiac:  normal S1, S2; RRR;  Lungs:  clear to auscultation bilaterally, no wheezing, rhonchi or rales Abd: soft, nontender, no hepatomegaly Ext: no edema Skin: warm and dry Neuro:   no focal abnormalities noted  EKG:  NSR, lateral ST segment changes     ASSESSMENT AND PLAN:  Coronary atherosclerosis of unspecified type of vessel, native or graft  Continue Aspirin Tablet, 325 MG, half tablet, Orally, Once a day Continue Isosorbide Mononitrate Tablet Extended Release 24 Hour, 30 MG, 1 tab, Orally, Once a day Continue Nitroglycerin 0.4 mg tablet, 0.4 mg, 1 tablet as directed, SL, as directed prn  chest pain, 25, Refills 6 Atypical chest pain.No reversible ischemia by stress test last year.  She has had an anterior MI.  EF preserved.  No CHF sx.  2. Mixed hyperlipidemia  Continue Crestor Tablet, 20 MG, 1 tablet, by mouth, once a day LDL 71 at last check. HDL 26, LDL 54 in 8/14 3. Essential hypertension, benign  Continue Atenolol Tablet, 25 MG, 1 tablet, by mouth, twice a day Continue Lisinopril Tablet, 10 mg, 1 tablet, Orally, Once a day Controlled when she takes her meds.   Could increase lisinopril if needed.  SHe has not taken her meds today. 4. Tobacco abuse  She wants to stop smoking. She needs to quit. SHe tried the electronic cigarette without success.  Signed, Fredric Mare, MD, [facility] 04/25/2013 12:09 PM

## 2013-04-25 NOTE — Patient Instructions (Signed)
Your physician wants you to follow-up in: 1 year with Dr. Varanasi. You will receive a reminder letter in the mail two months in advance. If you don't receive a letter, please call our office to schedule the follow-up appointment.  Your physician recommends that you continue on your current medications as directed. Please refer to the Current Medication list given to you today.  

## 2013-06-13 ENCOUNTER — Encounter (HOSPITAL_COMMUNITY): Payer: Self-pay | Admitting: Emergency Medicine

## 2013-06-13 ENCOUNTER — Emergency Department (HOSPITAL_COMMUNITY)
Admission: EM | Admit: 2013-06-13 | Discharge: 2013-06-13 | Disposition: A | Discharge status: Home / Self Care | Payer: Medicare Other | Attending: Emergency Medicine | Admitting: Emergency Medicine

## 2013-06-13 DIAGNOSIS — K589 Irritable bowel syndrome without diarrhea: Secondary | ICD-10-CM | POA: Insufficient documentation

## 2013-06-13 DIAGNOSIS — D649 Anemia, unspecified: Secondary | ICD-10-CM | POA: Insufficient documentation

## 2013-06-13 DIAGNOSIS — E785 Hyperlipidemia, unspecified: Secondary | ICD-10-CM | POA: Insufficient documentation

## 2013-06-13 DIAGNOSIS — Z79899 Other long term (current) drug therapy: Secondary | ICD-10-CM | POA: Insufficient documentation

## 2013-06-13 DIAGNOSIS — I959 Hypotension, unspecified: Secondary | ICD-10-CM

## 2013-06-13 DIAGNOSIS — F17200 Nicotine dependence, unspecified, uncomplicated: Secondary | ICD-10-CM | POA: Insufficient documentation

## 2013-06-13 DIAGNOSIS — I10 Essential (primary) hypertension: Secondary | ICD-10-CM | POA: Insufficient documentation

## 2013-06-13 DIAGNOSIS — M1990 Unspecified osteoarthritis, unspecified site: Secondary | ICD-10-CM | POA: Insufficient documentation

## 2013-06-13 DIAGNOSIS — Z853 Personal history of malignant neoplasm of breast: Secondary | ICD-10-CM | POA: Insufficient documentation

## 2013-06-13 DIAGNOSIS — I252 Old myocardial infarction: Secondary | ICD-10-CM | POA: Insufficient documentation

## 2013-06-13 DIAGNOSIS — E7800 Pure hypercholesterolemia, unspecified: Secondary | ICD-10-CM | POA: Insufficient documentation

## 2013-06-13 DIAGNOSIS — I2510 Atherosclerotic heart disease of native coronary artery without angina pectoris: Secondary | ICD-10-CM | POA: Insufficient documentation

## 2013-06-13 DIAGNOSIS — F411 Generalized anxiety disorder: Secondary | ICD-10-CM | POA: Insufficient documentation

## 2013-06-13 DIAGNOSIS — Z951 Presence of aortocoronary bypass graft: Secondary | ICD-10-CM | POA: Insufficient documentation

## 2013-06-13 DIAGNOSIS — I509 Heart failure, unspecified: Secondary | ICD-10-CM | POA: Insufficient documentation

## 2013-06-13 LAB — COMPREHENSIVE METABOLIC PANEL
ALK PHOS: 107 U/L (ref 39–117)
ALT: 14 U/L (ref 0–35)
AST: 30 U/L (ref 0–37)
Albumin: 4 g/dL (ref 3.5–5.2)
BILIRUBIN TOTAL: 0.3 mg/dL (ref 0.3–1.2)
BUN: 18 mg/dL (ref 6–23)
CO2: 28 mEq/L (ref 19–32)
Calcium: 9.6 mg/dL (ref 8.4–10.5)
Chloride: 92 mEq/L — ABNORMAL LOW (ref 96–112)
Creatinine, Ser: 1.37 mg/dL — ABNORMAL HIGH (ref 0.50–1.10)
GFR, EST AFRICAN AMERICAN: 45 mL/min — AB (ref >90)
GFR, EST NON AFRICAN AMERICAN: 39 mL/min — AB (ref >90)
GLUCOSE: 102 mg/dL — AB (ref 70–99)
POTASSIUM: 5.2 meq/L (ref 3.7–5.3)
Sodium: 135 mEq/L — ABNORMAL LOW (ref 137–147)
TOTAL PROTEIN: 8 g/dL (ref 6.0–8.3)

## 2013-06-13 LAB — CBC WITH DIFFERENTIAL/PLATELET
Basophils Absolute: 0 10*3/uL (ref 0.0–0.1)
Basophils Relative: 0 % (ref 0–1)
EOS ABS: 0.1 10*3/uL (ref 0.0–0.7)
Eosinophils Relative: 1 % (ref 0–5)
HCT: 43.2 % (ref 36.0–46.0)
HEMOGLOBIN: 14.6 g/dL (ref 12.0–15.0)
LYMPHS ABS: 1.8 10*3/uL (ref 0.7–4.0)
Lymphocytes Relative: 26 % (ref 12–46)
MCH: 31.1 pg (ref 26.0–34.0)
MCHC: 33.8 g/dL (ref 30.0–36.0)
MCV: 91.9 fL (ref 78.0–100.0)
MONOS PCT: 5 % (ref 3–12)
Monocytes Absolute: 0.4 10*3/uL (ref 0.1–1.0)
NEUTROS PCT: 67 % (ref 43–77)
Neutro Abs: 4.6 10*3/uL (ref 1.7–7.7)
Platelets: 195 10*3/uL (ref 150–400)
RBC: 4.7 MIL/uL (ref 3.87–5.11)
RDW: 14.1 % (ref 11.5–15.5)
WBC: 6.9 10*3/uL (ref 4.0–10.5)

## 2013-06-13 LAB — TROPONIN I

## 2013-06-13 MED ORDER — SODIUM CHLORIDE 0.9 % IV BOLUS (SEPSIS)
1000.0000 mL | Freq: Once | INTRAVENOUS | Status: AC
  Administered 2013-06-13: 1000 mL via INTRAVENOUS

## 2013-06-13 NOTE — ED Provider Notes (Signed)
CSN: [id number]     Arrival date & time 06/13/13  1341 History   First MD Initiated Contact with Patient 06/13/13 1352     Chief Complaint  Patient presents with  . Hypotension   (Consider location/radiation/quality/duration/timing/severity/associated sxs/prior Treatment) HPI Comments: Patient is a 68 year old female with history of coronary artery disease, hypertension, anemia, and severe osteoarthritis of both hips. She was sent today from her primary physician's office for evaluation of low blood pressure. She had a regular appointment there today and this abnormality was found. She denies any symptoms. She is having no chest pain, shortness of breath, headache, abdominal pain, fever, or any other.  The history is provided by the patient.    Past Medical History  Diagnosis Date  . Cancer     breast  . CAD (coronary artery disease)   . Hypertension   . High cholesterol   . MI (myocardial infarction)     w CABG  . Hyperlipidemia   . Pain in joint   . Osteoarthritis   . Anemia   . Anxiety   . CHF (congestive heart failure)   . IBS (irritable bowel syndrome)   . Osteoarthritis   . Back pain   . Hypothyroidism   . Edema   . GERD (gastroesophageal reflux disease)    Past Surgical History  Procedure Laterality Date  . Mastectomy      double  . Abdominal hysterectomy    . Coronary artery bypass graft    . Dilation and curettage of uterus    . Cholecystectomy    . Kidney stone surgery     Family History  Problem Relation Age of Onset  . CAD Father   . Heart attack Brother    History  Substance Use Topics  . Smoking status: Current Every Day Smoker  . Smokeless tobacco: Not on file  . Alcohol Use: Not on file   OB History   Grav Para Term Preterm Abortions TAB SAB Ect Mult Living                 Review of Systems  All other systems reviewed and are negative.    Allergies  Review of patient's allergies indicates no known allergies.  Home Medications    Current Outpatient Rx  Name  Route  Sig  Dispense  Refill  . atenolol (TENORMIN) 25 MG tablet   Oral   Take 25 mg by mouth 2 (two) times daily.         Marland Kitchen dicyclomine (BENTYL) 20 MG tablet   Oral   Take 20 mg by mouth every 4 (four) hours as needed (irritable bowl syndrome).         . DULoxetine (CYMBALTA) 60 MG capsule   Oral   Take 60 mg by mouth daily.         . fesoterodine (TOVIAZ) 4 MG TB24 tablet   Oral   Take by mouth daily. TAKING 4 MG IN A.M AND 8 MG AT NIGHT         . furosemide (LASIX) 20 MG tablet   Oral   Take 20 mg by mouth.         . gabapentin (NEURONTIN) 300 MG capsule   Oral   Take 300 mg by mouth 3 (three) times daily.         Marland Kitchen HYDROcodone-acetaminophen (NORCO) 10-325 MG per tablet   Oral   Take 1 tablet by mouth 2 (two) times daily as needed for pain.         Marland Kitchen  isosorbide mononitrate (IMDUR) 30 MG 24 hr tablet   Oral   Take 30 mg by mouth daily.         . promethazine (PHENERGAN) 25 MG tablet   Oral   Take 25 mg by mouth every 6 (six) hours as needed for nausea.         . rosuvastatin (CRESTOR) 20 MG tablet   Oral   Take 20 mg by mouth daily.          BP 92/54  Pulse 70  Temp(Src) 97.5 F (36.4 C)  SpO2 97% Physical Exam  Nursing note and vitals reviewed. Constitutional: She is oriented to person, place, and time. She appears well-developed and well-nourished. No distress.  HENT:  Head: Normocephalic and atraumatic.  Mouth/Throat: Oropharynx is clear and moist.  Eyes: EOM are normal. Pupils are equal, round, and reactive to light.  Neck: Normal range of motion. Neck supple.  Cardiovascular: Normal rate and regular rhythm.  Exam reveals no gallop and no friction rub.   No murmur heard. Pulmonary/Chest: Effort normal and breath sounds normal. No respiratory distress. She has no wheezes. She has no rales.  Abdominal: Soft. Bowel sounds are normal. She exhibits no distension and no mass. There is no tenderness. There  is no rebound and no guarding.  Musculoskeletal: Normal range of motion.  Lymphadenopathy:    She has no cervical adenopathy.  Neurological: She is alert and oriented to person, place, and time. No cranial nerve deficit. She exhibits normal muscle tone. Coordination normal.  Skin: Skin is warm and dry. She is not diaphoretic.    ED Course  Procedures (including critical care time) Labs Review Labs Reviewed  CBC WITH DIFFERENTIAL  COMPREHENSIVE METABOLIC PANEL  TROPONIN I   Imaging Review No results found.  EKG Interpretation    Date/Time:  Friday June 13 2013 14:22:52 EST Ventricular Rate:  74 PR Interval:  153 QRS Duration: 87 QT Interval:  402 QTC Calculation: 446 R Axis:   68 Text Interpretation:  Confirmed by DELOS  MD, Zhane Donlan ([id number]) on 06/13/2013 3:14:08 PM            MDM  No diagnosis found. Patient is a 68 year old female presents with complaints of low blood pressure. She was at her primary doctor's office for a routine appointment blood pressures there were 70/50. Per EMS her blood pressures colic was 295. Blood pressures here have been running around 188-416 systolic and she has no complaints. Her blood counts and laboratory studies are unremarkable and EKG is unchanged. Patient states she feels fine and wants to go home and I feel as though this is a reasonable disposition. She understands to return if she develops any concerning symptoms.    Veryl Speak, MD 06/13/13 [id number]

## 2013-06-13 NOTE — ED Notes (Signed)
Bed: WA12 Expected date:  Expected time:  Means of arrival:  Comments: EMS 

## 2013-06-13 NOTE — ED Notes (Signed)
Pt daughter [name] [phone] Call upon discharge for pickup.

## 2013-06-13 NOTE — Discharge Instructions (Signed)
Increase your fluid intake by 3-8 ounce glasses of water per day for the next 2 days.  Return to the emergency department if you develop chest pain, shortness of breath, high fever, or other new or concerning symptoms.   Hypotension As your heart beats, it forces blood through your arteries. This force is your blood pressure. If your blood pressure is too low for you to go about your normal activities or to support the organs of your body, you have hypotension. Hypotension is also referred to as low blood pressure. When your blood pressure becomes too low, you may not get enough blood to your brain. As a result, you may feel weak, feel lightheaded, or develop a rapid heart rate. In a more severe case, you may faint. CAUSES Various conditions can cause hypotension. These include:  Blood loss.  Dehydration.  Heart or endocrine problems.  Pregnancy.  Severe infection.  Not having a well-balanced diet filled with needed nutrients.  Severe allergic reactions (anaphylaxis). Some medicines, such as blood pressure medicine or water pills (diuretics), may lower your blood pressure below normal. Sometimes taking too much medicine or taking medicine not as directed can cause hypotension. TREATMENT  Hospitalization is sometimes required for hypotension if fluid or blood replacement is needed, if time is needed for medicines to wear off, or if further monitoring is needed. Treatment might include changing your diet, changing your medicines (including medicines aimed at raising your blood pressure), and use of support stockings. HOME CARE INSTRUCTIONS   Drink enough fluids to keep your urine clear or pale yellow.  Take your medicines as directed by your health care provider.  Get up slowly from reclining or sitting positions. This gives your blood pressure a chance to adjust.  Wear support stockings as directed by your health care provider.  Maintain a healthy diet by including nutritious food,  such as fruits, vegetables, nuts, whole grains, and lean meats. SEEK MEDICAL CARE IF:  You have vomiting or diarrhea.  You have a fever for more than 2 3 days.  You feel more thirsty than usual.  You feel weak and tired. SEEK IMMEDIATE MEDICAL CARE IF:   You have chest pain or a fast or irregular heartbeat.  You have a loss of feeling in some part of your body, or you lose movement in your arms or legs.  You have trouble speaking.  You become sweaty or feel lightheaded.  You faint. MAKE SURE YOU:   Understand these instructions.  Will watch your condition.  Will get help right away if you are not doing well or get worse. Document Released: 04/24/2005 Document Revised: 02/12/2013 Document Reviewed: 10/25/2012 [facility] Patient Information 2014 [place].

## 2013-06-13 NOTE — ED Notes (Signed)
Daughter, Holley Raring on way to get pt.

## 2013-06-13 NOTE — ED Notes (Signed)
Pt at PCP. PCP called EMS for evaluation for hypotension. Per PCP BP 70/50. With EMS BP 100/70. Pt denies other complaints at present time.

## 2013-06-20 ENCOUNTER — Ambulatory Visit
Admission: RE | Admit: 2013-06-20 | Discharge: 2013-06-20 | Disposition: A | Discharge status: Home / Self Care | Payer: Medicare Other | Source: Ambulatory Visit | Attending: Family Medicine | Admitting: Family Medicine

## 2013-06-20 ENCOUNTER — Other Ambulatory Visit: Payer: Self-pay | Admitting: Family Medicine

## 2013-06-20 DIAGNOSIS — M545 Low back pain, unspecified: Secondary | ICD-10-CM

## 2013-08-11 ENCOUNTER — Other Ambulatory Visit: Payer: Self-pay | Admitting: *Deleted

## 2013-08-11 MED ORDER — ISOSORBIDE MONONITRATE ER 30 MG PO TB24: 30.0000 mg | ORAL_TABLET | Freq: Every morning | ORAL | Status: AC

## 2013-12-06 DEATH — deceased

## 2014-01-26 ENCOUNTER — Telehealth: Payer: Self-pay | Admitting: Interventional Cardiology

## 2014-01-26 NOTE — Telephone Encounter (Signed)
New problem   [name] returning your call.

## 2014-01-26 NOTE — Telephone Encounter (Signed)
lmtrc

## 2014-02-03 NOTE — Telephone Encounter (Signed)
Follow up      Talk to Amy to verify diagnosis Dr Irish Lack put on the form

## 2014-02-03 NOTE — Telephone Encounter (Signed)
Returned Courtney's call and went over form we had faxed in.
